# Patient Record
Sex: Male | Born: 1945
Health system: Southern US, Community
[De-identification: ages and names within clinical notes are randomized; demographics above are authoritative.]

## PROBLEM LIST (undated history)

## (undated) DIAGNOSIS — I2510 Atherosclerotic heart disease of native coronary artery without angina pectoris: Secondary | ICD-10-CM

## (undated) DIAGNOSIS — I739 Peripheral vascular disease, unspecified: Secondary | ICD-10-CM

## (undated) DIAGNOSIS — E119 Type 2 diabetes mellitus without complications: Secondary | ICD-10-CM

## (undated) DIAGNOSIS — I779 Disorder of arteries and arterioles, unspecified: Secondary | ICD-10-CM

## (undated) DIAGNOSIS — E785 Hyperlipidemia, unspecified: Secondary | ICD-10-CM

## (undated) DIAGNOSIS — I119 Hypertensive heart disease without heart failure: Secondary | ICD-10-CM

## (undated) DIAGNOSIS — Z8719 Personal history of other diseases of the digestive system: Secondary | ICD-10-CM

## (undated) DIAGNOSIS — I219 Acute myocardial infarction, unspecified: Secondary | ICD-10-CM

## (undated) HISTORY — DX: Morbid (severe) obesity due to excess calories: E66.01

## (undated) HISTORY — DX: Disorder of arteries and arterioles, unspecified: I77.9

## (undated) HISTORY — PX: UMBILICAL HERNIA REPAIR: SHX196

## (undated) HISTORY — DX: Type 2 diabetes mellitus without complications: E11.9

## (undated) HISTORY — DX: Personal history of other diseases of the digestive system: Z87.19

## (undated) HISTORY — DX: Peripheral vascular disease, unspecified: I73.9

## (undated) HISTORY — DX: Hypertensive heart disease without heart failure: I11.9

## (undated) HISTORY — DX: Atherosclerotic heart disease of native coronary artery without angina pectoris: I25.10

## (undated) HISTORY — PX: CATARACT EXTRACTION: SUR2

## (undated) HISTORY — DX: Hyperlipidemia, unspecified: E78.5

---

## 1999-07-17 ENCOUNTER — Emergency Department (HOSPITAL_COMMUNITY): Admission: EM | Admit: 1999-07-17 | Discharge: 1999-07-17 | Payer: Self-pay | Admitting: Emergency Medicine

## 2000-04-05 ENCOUNTER — Encounter (INDEPENDENT_AMBULATORY_CARE_PROVIDER_SITE_OTHER): Payer: Self-pay

## 2000-04-05 ENCOUNTER — Ambulatory Visit (HOSPITAL_COMMUNITY): Admission: RE | Admit: 2000-04-05 | Discharge: 2000-04-05 | Payer: Self-pay | Admitting: General Surgery

## 2000-04-05 ENCOUNTER — Encounter: Payer: Self-pay | Admitting: General Surgery

## 2002-02-22 ENCOUNTER — Encounter: Payer: Self-pay | Admitting: Vascular Surgery

## 2002-02-24 ENCOUNTER — Inpatient Hospital Stay (HOSPITAL_COMMUNITY): Admission: RE | Admit: 2002-02-24 | Discharge: 2002-02-25 | Payer: Self-pay | Admitting: Vascular Surgery

## 2002-02-24 ENCOUNTER — Encounter (INDEPENDENT_AMBULATORY_CARE_PROVIDER_SITE_OTHER): Payer: Self-pay | Admitting: Specialist

## 2002-02-24 HISTORY — PX: CAROTID ENDARTERECTOMY: SUR193

## 2005-08-14 ENCOUNTER — Ambulatory Visit (HOSPITAL_COMMUNITY): Admission: RE | Admit: 2005-08-14 | Discharge: 2005-08-14 | Payer: Self-pay | Admitting: Internal Medicine

## 2005-08-24 ENCOUNTER — Ambulatory Visit (HOSPITAL_COMMUNITY): Admission: RE | Admit: 2005-08-24 | Discharge: 2005-08-24 | Payer: Self-pay | Admitting: Cardiology

## 2005-09-11 ENCOUNTER — Inpatient Hospital Stay (HOSPITAL_COMMUNITY): Admission: RE | Admit: 2005-09-11 | Discharge: 2005-09-19 | Payer: Self-pay | Admitting: Surgery

## 2005-09-11 HISTORY — PX: CORONARY ARTERY BYPASS GRAFT: SHX141

## 2005-10-13 ENCOUNTER — Encounter: Admission: RE | Admit: 2005-10-13 | Discharge: 2005-10-13 | Payer: Self-pay | Admitting: Surgery

## 2007-03-22 IMAGING — CR DG CHEST 2V
3 series · 3 of 3 positions shown · non-contrast
Comparison: 08/24/05.

CLINICAL DATA: Pre-op respiratory exam for heart surgery.  Ex-smoker.  
 PA AND LATERAL CHEST:

[view not recorded (1 of 3)]
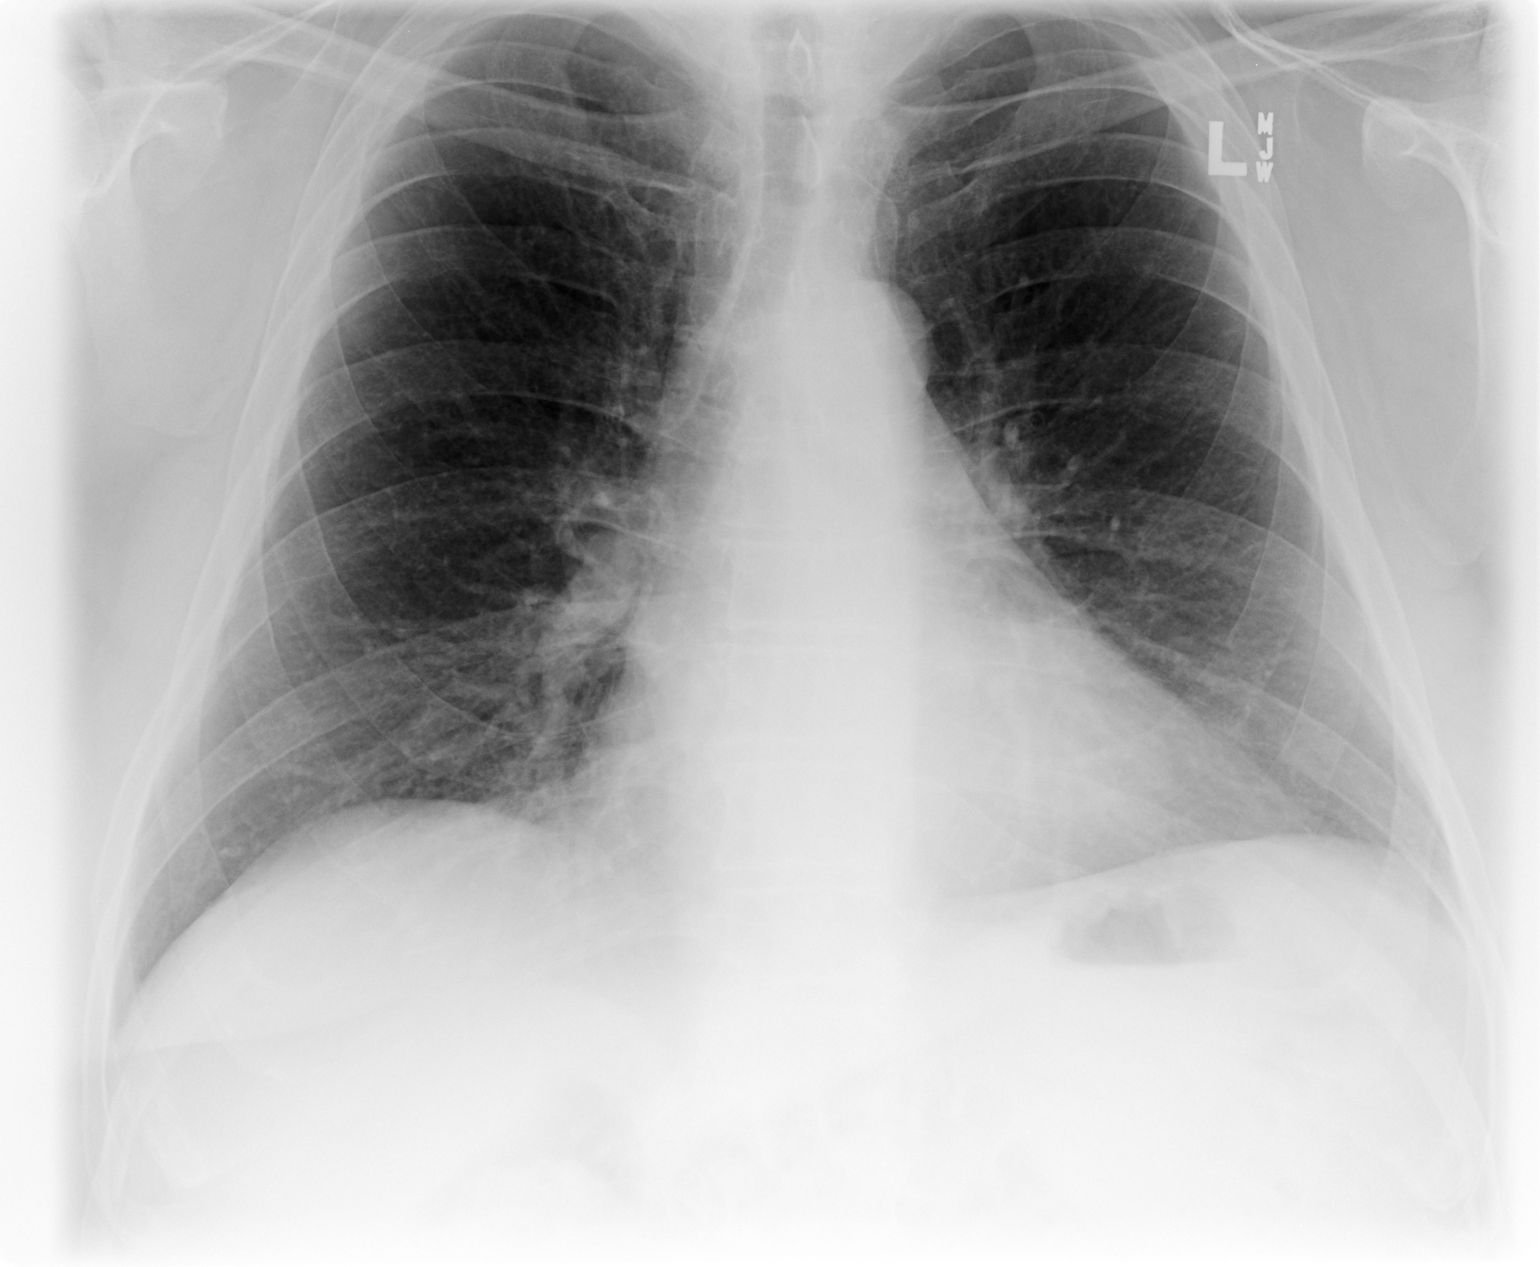

[view not recorded (2 of 3)]
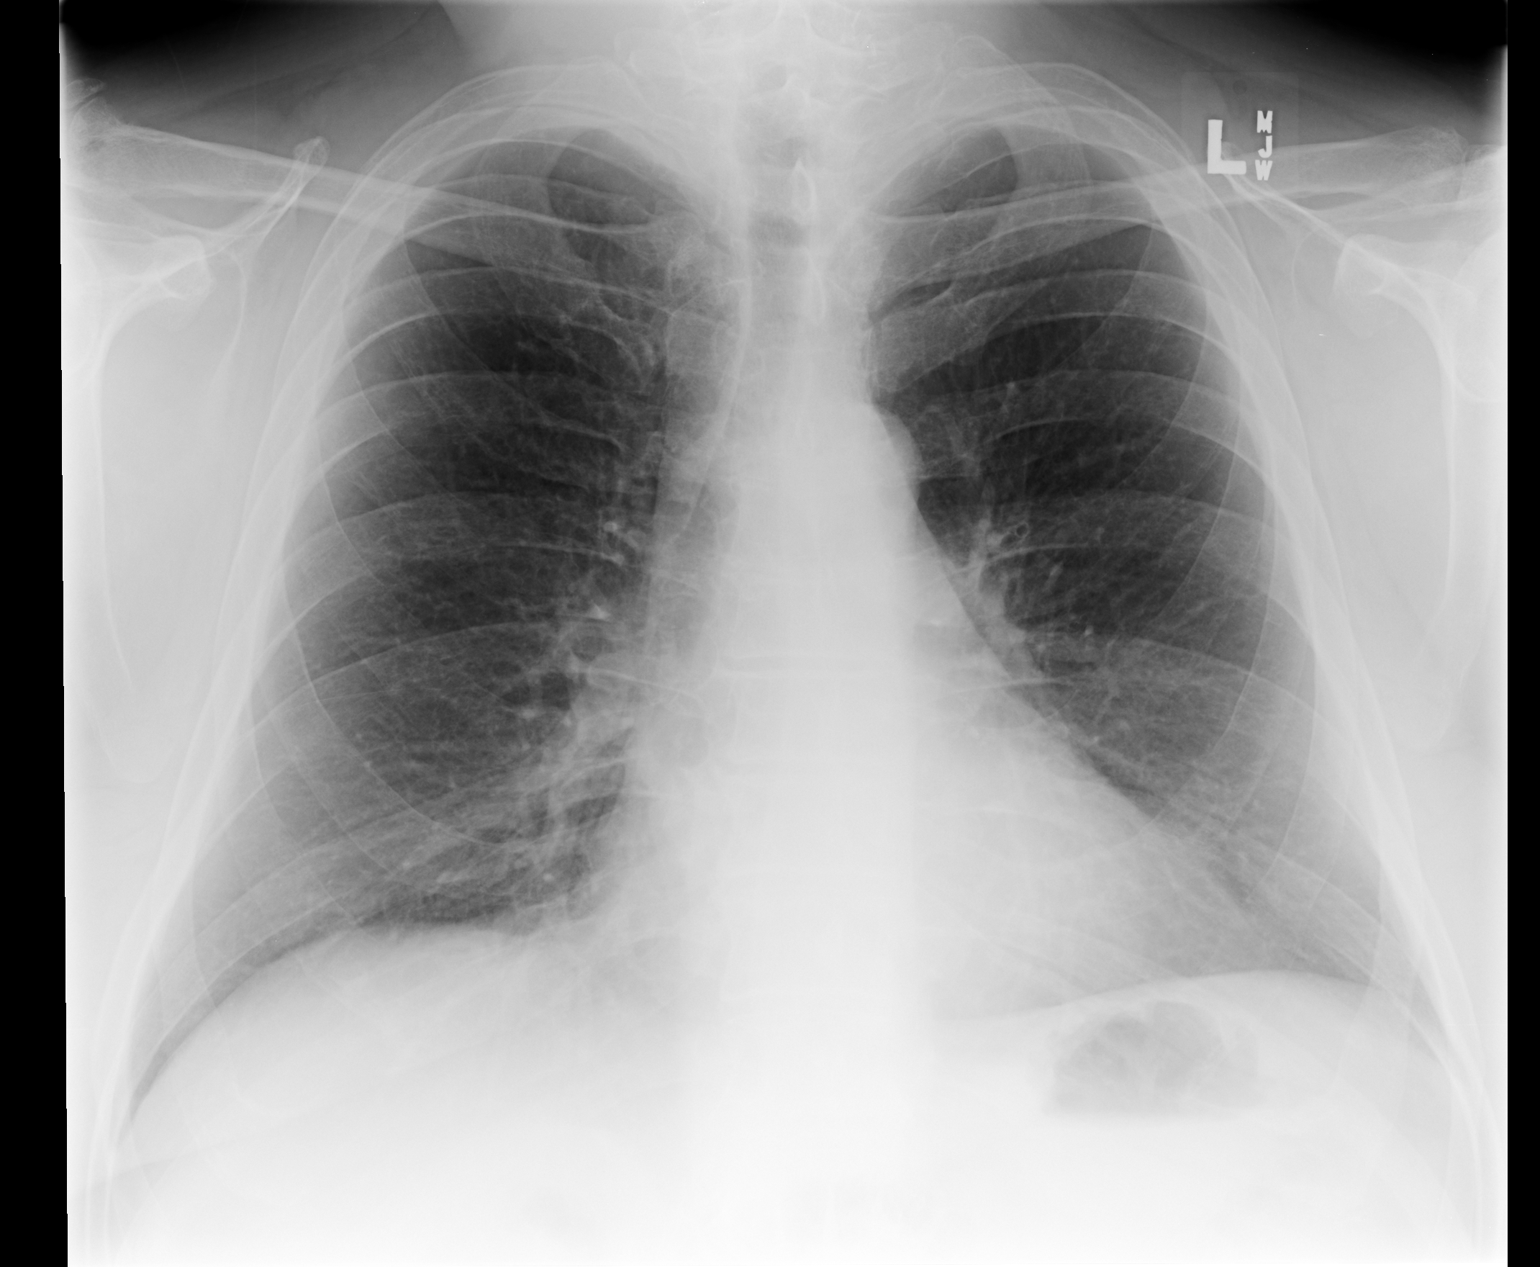

[view not recorded (3 of 3)]
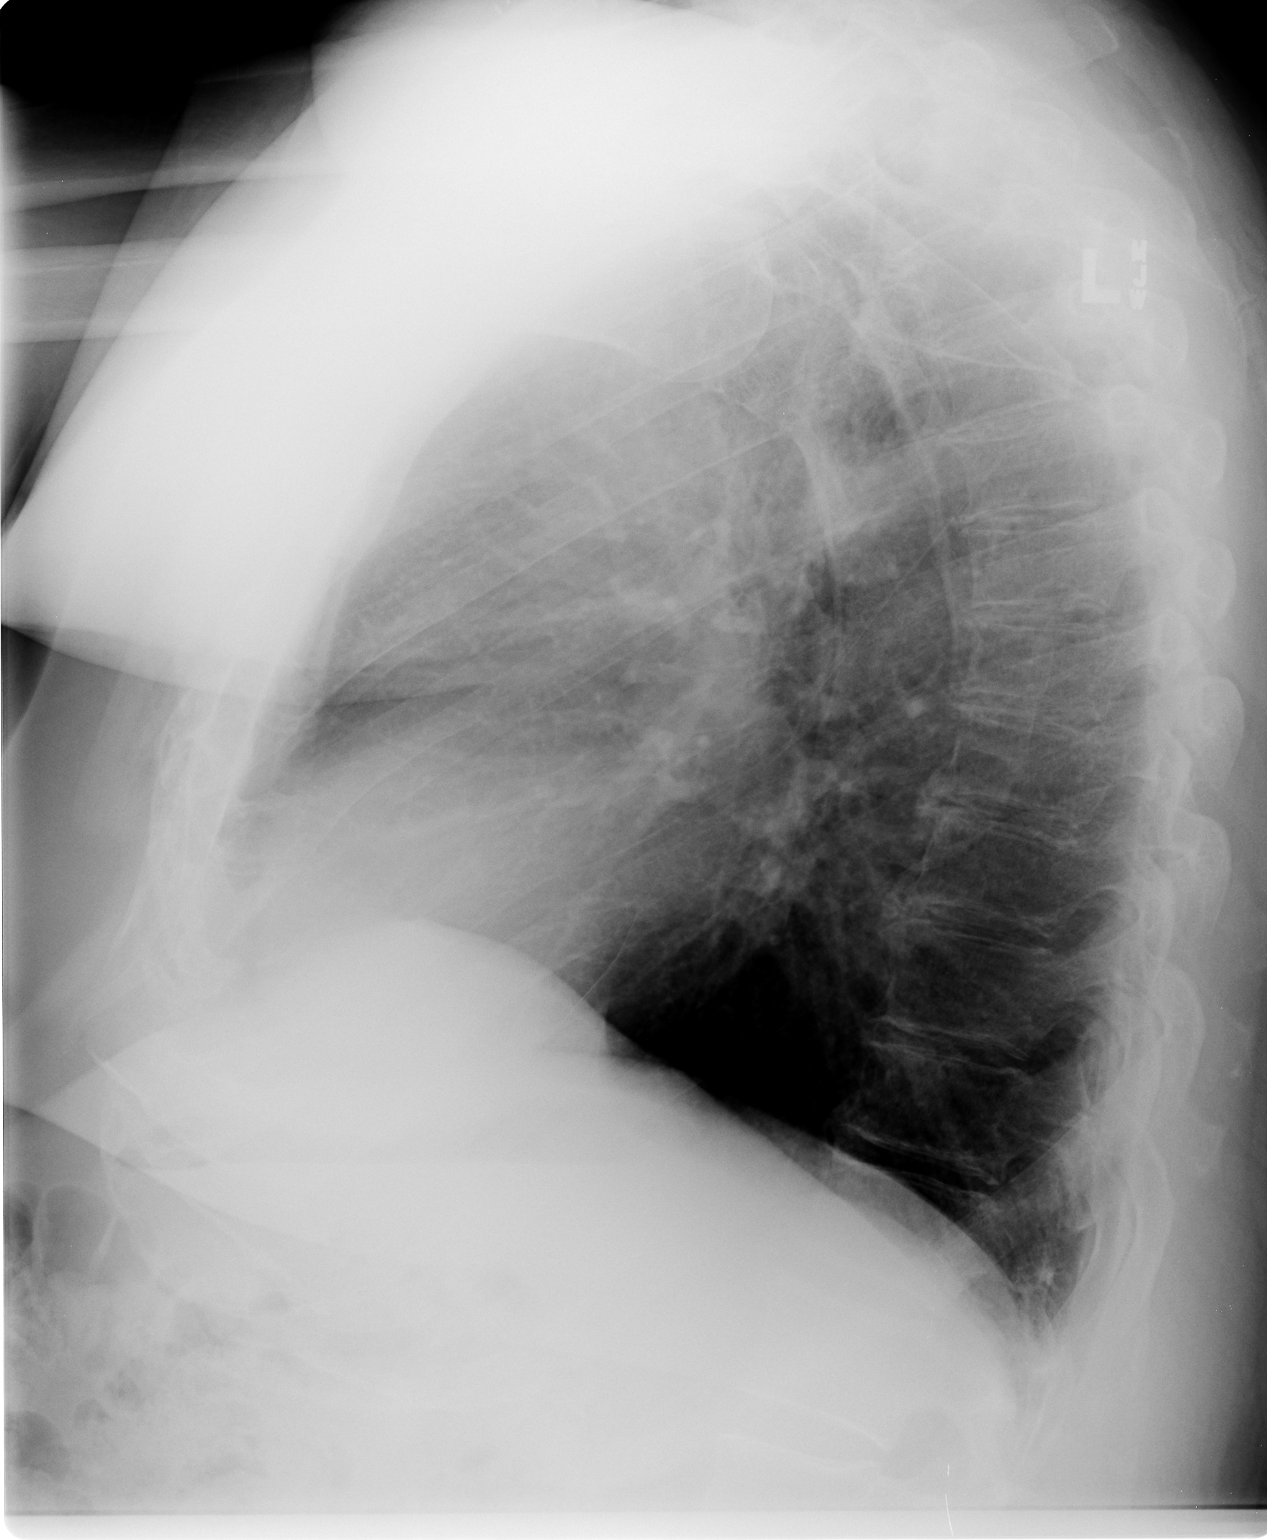

[3 of 3 positions shown; findings below may reference images not displayed]

FINDINGS: Heart size is stable at the upper limits of normal.  The lungs are clear.  There is no pleural effusion or pneumothorax.
IMPRESSION: Stable exam with borderline heart size.  No acute chest finding.

## 2007-08-19 ENCOUNTER — Ambulatory Visit: Payer: Self-pay | Admitting: Vascular Surgery

## 2008-03-23 ENCOUNTER — Ambulatory Visit: Payer: Self-pay | Admitting: Vascular Surgery

## 2010-08-10 ENCOUNTER — Encounter: Payer: Self-pay | Admitting: Surgery

## 2010-12-02 NOTE — Procedures (Signed)
CAROTID DUPLEX EXAM   INDICATION:  Followup evaluation of known carotid artery disease.   HISTORY:  Diabetes:  No.  Cardiac:  Coronary artery bypass graft in 2007.  Hypertension:  Yes.  Smoking:  Quit 1996.  Previous Surgery:  Left carotid endarterectomy 02/24/2002 by Dr. Arbie Cookey.  CV History:  The patient reports no cerebrovascular symptoms at this  time.  Previous duplex performed 08/19/2007 revealed a 60-79% right ICA  stenosis and no ICA stenosis status post left carotid endarterectomy.  Amaurosis Fugax, Paresthesias, Hemiparesis No                                       RIGHT             LEFT  Brachial systolic pressure:         118               116  Brachial Doppler waveforms:         Triphasic         Triphasic  Vertebral direction of flow:        Antegrade         Antegrade  DUPLEX VELOCITIES (cm/sec)  CCA peak systolic                   81                108  ECA peak systolic                   137               94  ICA peak systolic                   163               67  ICA end diastolic                   65                35  PLAQUE MORPHOLOGY:                  None              Soft  PLAQUE AMOUNT:                      Moderate          Mild  PLAQUE LOCATION:                    Proximal ICA, ECA Proximal ICA, CCA   IMPRESSION:  1. 60-79% right ICA stenosis.  2. 20-39% left ICA stenosis status post endarterectomy.  3. No significant change from previous study performed 08/19/2007.   ___________________________________________  Larina Earthly, M.D.   MC/MEDQ  D:  03/23/2008  T:  03/24/2008  Job:  [id number]

## 2010-12-02 NOTE — Procedures (Signed)
CAROTID DUPLEX EXAM   INDICATION:  Follow up known carotid artery disease.   HISTORY:  Diabetes:  No.  Cardiac:  Yes, CABG in 2007  Hypertension:  Yes.  Smoking:  Quit in 1996.  Previous Surgery:  Left carotid endarterectomy in 2003.  CV History:  No.  Amaurosis Fugax:  No, Paresthesias:  No, Hemiparesis:  No                                       RIGHT             LEFT  Brachial systolic pressure:         150               130  Brachial Doppler waveforms:         Biphasic          Biphasic  Vertebral direction of flow:        Antegrade         Antegrade  DUPLEX VELOCITIES (cm/sec)  CCA peak systolic                   80                93  ECA peak systolic                   141               99  ICA peak systolic                   221               59  ICA end diastolic                   73                20  PLAQUE MORPHOLOGY:                  Heterogeneous     None  PLAQUE AMOUNT:                      Moderate          None  PLAQUE LOCATION:                    ICA and ECA       None   IMPRESSION:  60% to 79% stenosis noted in the right internal carotid  artery.  Normal carotid duplex in left internal carotid artery.  Status  post left carotid endarterectomy.  Antegrade bilateral vertebral  arteries.   ___________________________________________  Larina Earthly, M.D.   MG/MEDQ  D:  08/19/2007  T:  08/20/2007  Job:  [id number]

## 2010-12-05 NOTE — Op Note (Signed)
NAME:  Ronnie Myers, Ronnie Myers NO.:  [id number]   MEDICAL RECORD NO.:  [id number]          PATIENT TYPE:  INP   LOCATION:  2304                         FACILITY:  [facility]   PHYSICIAN:  Evelene Croon, M.D.     DATE OF BIRTH:  [birth year]   DATE OF PROCEDURE:  09/11/2005  DATE OF DISCHARGE:                                 OPERATIVE REPORT   PREOPERATIVE DIAGNOSIS:  Severe three-vessel coronary disease.   POSTOPERATIVE DIAGNOSIS:  Severe three-vessel coronary disease.   OPERATIVE PROCEDURE:  Median sternotomy, extracorporeal circulation,  coronary bypass graft surgery x 4 using a left internal mammary artery graft  to the left anterior descending coronary, with a sequential saphenous vein  graft to the diagonal branch of the left anterior descending and the obtuse  marginal branch of the left circumflex coronary, and a saphenous vein graft  to the posterior descending branch of the right coronary.  Endoscopic vein  harvest from the right leg.   SURGEON:  Evelene Croon, M.D.   ASSISTANT:  Pecola Leisure, P.A.-C.  Sharla Kidney, P.A.-C.   ANESTHESIA:  General endotracheal.   CLINICAL HISTORY:  This patient is a 65 year old gentleman with history of  hypertension, hyperlipidemia, newly diagnosed diabetes, and obesity, who  presents with a 6-8 month history of chest discomfort typically occurring  with exertion.  He has had some episodes awaken him from sleep.  He  underwent coronary angiography that showed total occlusion of the proximal  right coronary artery with faint filling of the distal vessel by  collaterals.  The proximal LAD was occluded with faint collateral filling of  the distal vessel.  The left circumflex had a 90% stenosis and a large  marginal branch.  The right coronary gave off a moderate sized acute  marginal branch that had about 70% midvessel stenosis.  Left ventricular  ejection fraction about 55% with hypokinesis of the anterior wall.  There is  no  gradient across the aortic valve and no mitral regurgitation.  After  review of the angiogram and examination the patient, it was felt that  coronary bypass graft surgery is the best treatment.  I discussed operative  procedure with the patient and his wife including alternatives, benefits,  and risks including bleeding, blood transfusion, infection, stroke,  myocardial infarction, graft failure, and death.  We also discussed the  importance of maximum cardiac satisfactory reduction.  He understood and  agreed to proceed.   OPERATIVE PROCEDURE:  The patient was taken to the operating room and placed  on the table supine position.  After induction of general endotracheal  anesthesia, a Foley catheter was placed in bladder using sterile technique.  Then, the chest, abdomen, and both lower extremities were prepped and draped  in usual sterile manner.  The chest was entered through the hysterotomy  incision and the pericardium opened in the midline.  Examination heart  showed good ventricular contractility.  The ascending aorta had no palpable  plaques in it.   Then, the left internal mammary artery was harvested from the chest was a  pedicle graft.  This is  a medium caliber vessel with excellent blood flow  through it.  At the same time, a segment of greater saphenous vein was  harvested from the right leg using endoscopic vein harvest technique.  This  vein was of large caliber and good quality.  Below the knee, this vein did  not follow the course of a typical saphenous vein and began to deviate  towards the lateral aspect the leg.  It also became small and we did not  follow it any further.  We did examine the saphenous vein adjacent to the  left knee, but it was a very large vein and I did not feel it would be ideal  for sewing to this patient's relatively small coronary arteries. Therefore,  I decided not harvest any further vein and to use the two lengths that I had  from the right  thigh.   Then, the patient was heparinized and when an adequate activated clotting  time was achieved, the distal ascending aorta was cannulated using a 24  French aortic cannula for arterial in flow.  Venous outflow was achieved  using a two-stage venous cannula for the right atrial appendage.  An  antegrade cardioplegia and vent cannula was inserted in the aortic root.  Retrograde cardioplegic cannula was inserted through a pursestring suture in  the right atrium.   Then the patient placed on cardiopulmonary bypass and distal coronaries  identified.  The LAD was a relatively small, short artery.  It was suitable  for grafting.  The diagonal branch is small but graftable.  The obtuse  marginal was a large vessel that was very heavily diseased and this disease  continued up to the trifurcation into three sub-branches.  The right  coronary was diffusely diseased.  There was a moderate size posterior  descending branch that was heavily diseased proximally.  There was a small  posterolateral branch which was not large to graft itself.  The acute  marginal branch was lying deep beneath the thick layer of epicardial fat and  was also diffusely diseased and I did not feel this was graftable.   Then, the aorta was crossclamped and 500 mL of cold blood antegrade  cardioplegia was administered into the aortic root with quick arrest the  heart.  This was followed by 500 mL of cold blood retrograde cardioplegia.  Systemic hypothermia to 20 degrees centigrade and topical hypothermic with  iced saline was used.   Then, the first distal anastomosis was performed to the diagonal branch.  The internal diameter was about 1.5 mm.  The conduit used was a segment of  greater saphenous vein.  The anastomosis was performed in a sequential side-  to-side manner using continuous 7-0 Prolene suture.  Flow was measured  through the graft and was excellent.  The second distal anastomosis was performed to the  obtuse marginal branch.  The internal diameter of this vessel was about 1.75 mm.  The conduit used  was the same segment of greater saphenous vein.  The anastomosis was  performed in a sequential end-to-side manner using continuous 7-0 Prolene  suture.  Flow was measured through the graft and was excellent.  Another  dose of cardioplegia was given down vein graft in a retrograde manner.   A third distal anastomosis was performed to the posterior descending  coronary.  The internal diameter of this lesion was about 1.6 mm.  The  conduit used was a second segment of greater saphenous vein.  The  anastomosis was  performed in an end-to-side manner using continuous 7-0  Prolene suture.  Flow was measured through the graft and was excellent.   The fourth distal anastomosis was formed to the midportion of the left  anterior descending coronary.  The internal diameter was about 1.6 mm.  Conduit used was the left internal mammary artery graft and this was brought  through an opening in the left pericardium anterior to the phrenic nerve.  It was anastomosed the LAD in an end-to-side manner using a continuous 8-0  Prolene suture.  The pedicle was sutured to the epicardium using 6-0 Prolene  sutures.   Then, the patient was rewarmed to 37 degrees centigrade.  The two proximal  vein graft anastomoses were performed to the aortic root in an end-to-side  manner using continuous 6-0 Prolene suture.  Then, the clamp was removed  from the mammary pedicle.  There was rapid warming of the ventricular septum  and return of spontaneous ventricular fibrillation.  The crossclamp was  removed with a time of 87 minutes.  There is spontaneous return of sinus  rhythm.   The proximal and distal anastomoses appeared hemostatic and the lie of the  grafts satisfactory.  Graft markers were placed on the proximal anastomosis.  Two temporary right ventricular and right atrial pacing wires were placed  and brought  through the skin.   With the patient rewarmed 37 degrees centigrade, he was weaned from  cardiopulmonary bypass on no inotropic agents.  Total bypass time was 112  minutes.  Cardiac function appeared excellent with a cardiac output of 8  liters minute.  Protamine was given.  The venous and aortic cannulae were  removed without difficulty.  Hemostasis was achieved.  Three chest tubes  were placed, with two in the post pericardium, one in the left pleural  space, and one in the anterior mediastinum.  The pericardium was loosely  reapproximated over the heart.  The sternum was closed with number 6  stainless steel wires.  The fascia was closed with continuous #1 Vicryl  suture.  The subcutaneous tissue was closed with continuous 2-0 Vicryl and  the skin with 3-0 Vicryl subcuticular closure. The lower extremity vein  harvest site was closed in layers in a similar manner.  The sponge, needle, and counts were correct according to the scrub nurse.  Dry sterile dressings  were applied over the incisions and around the chest tubes which were hooked  to Pleur-Evac suction.  The patient remained hemodynamically stable and was  transferred to the SICU in guarded but stable condition.      Evelene Croon, M.D.  Electronically Signed     BB/MEDQ  D:  09/11/2005  T:  09/11/2005  Job:  [id number]   cc:   Georga Hacking, M.D.  Fax: [phone]  Email: [email or web address]

## 2010-12-05 NOTE — Discharge Summary (Signed)
NAME:  Ronnie Myers, Ronnie Myers                       ACCOUNT NO.:  [id number]   MEDICAL RECORD NO.:  [id number]                   PATIENT TYPE:  INP   LOCATION:  3307                                 FACILITY:  [facility]   PHYSICIAN:  Larina Earthly, M.D.                 DATE OF BIRTH:  1945/09/26   DATE OF ADMISSION:  02/24/2002  DATE OF DISCHARGE:                                 DISCHARGE SUMMARY   PRIMARY ADMITTING DIAGNOSIS:  Asymptomatic severe left internal carotid  artery stenosis.   ADDITIONAL DIAGNOSES:  1. Moderate right carotid stenosis.  2. Hyperlipidemia.  3. Hypertension.   PROCEDURE PERFORMED:  Left carotid endarterectomy with Dacron patch  angioplasty.   HISTORY OF PRESENT ILLNESS:  The patient is a 65 year old white male, a  patient of Dr. Jarome Matin who was found on recent physical examination  to have a carotid bruit.  He was referred to the cardiovascular and thoracic  surgery office and underwent a duplex scan which showed severe stenosis of  the left internal carotid artery with moderate stenosis on the right.  He  was seen and his tests were reviewed by Dr. Arbie Cookey, and it was felt that, in  order to reduce his risk of stroke, he should proceed with surgical  intervention at this time.   HOSPITAL COURSE:  The patient was taken to the operating room on August 8  and underwent a left carotid endarterectomy with Dacron patch angioplasty.  He tolerated the procedure well and was transferred to the floor in stable  condition.  Postoperatively, he has done well.  He has remained afebrile and  vital signs have been stable.  He has been weaned off supplemental oxygen  and his oxygen saturations have been greater than 90% on room air.  He has  been tolerating a regular diet and has normal bowel and bladder function.  His incision is healing well.  He has remained neurologically intact  throughout the intact.  It is felt at this time he may be discharged home.   DISCHARGE MEDICATIONS:  1. Tylox 1-2 p.o. q.4h. p.r.n. for pain.  2. Enteric-coated aspirin 325 mg p.o. q.d.  3. Pravachol 40 mg p.o. q.h.s.  4. Toprol XL 25 mg p.o. q.d.   DISCHARGE INSTRUCTIONS:  He is to refrain from driving, heavy lifting, or  strenuous activity.  He may continue daily walking and the use of the  incentive spirometer.  He may shower daily and clean his incisions with soap  and water.    FOLLOWUP APPOINTMENTS:  He will be seen in the cardiovascular and thoracic  surgery office by Dr. Arbie Cookey in three weeks.  He is asked to call if he has  any problems in the interim.     Gina L. Thomasena Edis, P.A.                     Larina Earthly,  M.D.    GLC/MEDQ  D:  02/25/2002  T:  03/01/2002  Job:  [id number]   cc:   Barry Dienes. Eloise Harman, M.D.

## 2010-12-05 NOTE — Cardiovascular Report (Signed)
NAME:  Ronnie Myers, Ronnie Myers NO.:  [id number]   MEDICAL RECORD NO.:  [id number]          PATIENT TYPE:  OIB   LOCATION:  2899                         FACILITY:  [facility]   PHYSICIAN:  Georga Hacking, M.D.DATE OF BIRTH:  1946-02-28   DATE OF PROCEDURE:  08/24/2005  DATE OF DISCHARGE:                              CARDIAC CATHETERIZATION   HISTORY:  The patient is a 65 year old male with hypertension, glucose  intolerance, hyperlipidemia and obesity who presented with chest discomfort  worse after meals but also with exertion.  He had an abnormal EKG with  previous anterior infarction on it.  Because of the progressive natures of  symptoms, he was taken directly to catheterization.   PROCEDURE:  Left heart catheterization with coronary angiograms and left  ventriculogram, Angio-Seal closure of right femoral artery,.   COMMENTS ABOUT PROCEDURE:  The patient tolerated the procedure well without  complications. The right femoral artery was entered using a single anterior  needle wall stick. He tolerated suture well without complications. Following  the procedure, he had a right femoral angiogram done and then Angio-Seal  closure of the right femoral artery was done without complications with good  hemostasis.   HEMODYNAMIC DATA:  Aorta post contrast 152/81, LV post contrast 152/10-29.   ANGIOGRAPHIC DATA:  Left ventriculogram:  Performed in the 30 degree RAO  projection. The aortic valve is normal. The mitral valve is normal. The left  ventricle appears normal in size. There is hypokinesis of the anterior wall  with an estimated ejection fraction of 55%, remaining ventricular segments  contracted normally. Coronary arteries arise and distribute normally,  significant calcifications noted involving the proximal left main, LAD and  proximal and mid right coronary artery.  The left main coronary artery  appears normal with mild calcification.   Left anterior descending:   100% occluded proximally after the septal  perforator, faint system collaterals is seen filling the LAD through the  left system. A diagonal branch is also seen.   Circumflex coronary artery:  Severe 90% stenosis prior to a large marginal  branch. The continuation branch of the circumflex supplies a second and  third marginal artery that contained mild irregularity.  A large atrial  collateral is seen supplying the distal right coronary artery which is  occluded. The right coronary is calcified in its proximal and mid portion.  It gives off a large acute marginal branch with an 80% stenosis prior to the  origin of this.  There is a 70% mid vessel stenosis in this acute marginal  branch which is a large vessel and appears to supply collaterals to either  the distal right coronary artery or the distal LAD.  The continuation of the  right artery is occluded and collateral filling is seen from the left  coronary system.   IMPRESSION:  1.  Severe three-vessel coronary disease with total occlusion of right      coronary artery, left anterior descending and severe stenosis involving      the first marginal branch of the obtuse marginal artery and severe  disease involving      the acute marginal artery which is large of the right coronary artery.  2.  Successful Angio-Seal of right femoral artery.   RECOMMENDATIONS:  The patient will be considered for coronary bypass  grafting.      Georga Hacking, M.D.  Electronically Signed     WST/MEDQ  D:  08/24/2005  T:  08/24/2005  Job:  [id number]   cc:   Barry Dienes. Eloise Harman, M.D.  Fax: [phone]

## 2010-12-05 NOTE — H&P (Signed)
NAME:  Ronnie Myers, Ronnie Myers                       ACCOUNT NO.:  [id number]   MEDICAL RECORD NO.:  [id number]                   PATIENT TYPE:  INP   LOCATION:  3307                                 FACILITY:  [facility]   PHYSICIAN:  Maple Mirza, PA                DATE OF BIRTH:  08-Jun-1946   DATE OF ADMISSION:  DATE OF DISCHARGE:  02/25/2002                                HISTORY & PHYSICAL   PRESENTING PROBLEM:  I am here for a physical examination, getting ready  for surgery.   HISTORY OF PRESENT ILLNESS:  The patient is a 65 year old male referred by  Dr. Eloise Harman for evaluation of carotid artery disease.  A carotid bruit was  detected in physical examination and a follow up carotid duplex which was  done on 02/08/02, demonstrated severe left internal carotid artery stenosis  and moderate right internal carotid artery stenosis.  He has been having  symptoms occasionally of tingling in the right hand when driving.  He  presents for a left carotid endarterectomy on 02/24/02.  He has been seen by  Dr. Tawanna Cooler Early who described the risks and benefits of the surgery with  primary effect to prevent stroke.  The patient denies any prior history of  syncope, seizure, unilateral weakness, or numbness in the upper or lower  extremities.  No history of amaurosis fugax, no diplopia, no memory loss or  confusion.  The patient has no history of diabetes mellitus, coronary artery  disease, or peptic ulcer disease.   PAST MEDICAL HISTORY:  1. Extracranial cerebrovascular occlusive disease.  2. Hypertension.  3. Hypercholesterolemia.   MEDICATIONS:  1. Enteric coated aspirin 325 mg q.d.  2. Pravachol 40 mg q.d. at bedtime.  3. Toprol XL 25 mg q.d.   PAST SURGICAL HISTORY:  Status post umbilical herniorrhaphy in 9/01.   ALLERGIES:  No known drug allergies.   FAMILY HISTORY:  Significant for his father dying at age 55 of a myocardial  infarction.   SOCIAL HISTORY:  The patient is married.  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  He does not partake of  alcoholic beverages.  He did have a history of tobacco habituation with a 1-  1/2 pack per day smoking history spanning 35+ years, he quit in 1996.   PHYSICAL EXAMINATION:  GENERAL:  This is an alert and oriented gentleman in  no acute distress.  He is moderately obese.  VITAL SIGNS:  Blood pressure 134/82, left upper extremity, pulse 68 and  regular.  HEENT:  Normocephalic, atraumatic.  Pupils equal, round, reactive to light.  Extraocular movements were intact.  NECK:  Supple, no jugular venous distention.  A soft left carotid bruit is  auscultated.  Right carotid bruit is not present.  LUNGS:  Clear to auscultation and percussion bilaterally,  HEART:  Regular rate and rhythm, without murmurs, rubs, or gallops.  ABDOMEN:  Soft, nontender, nondistended, bowel sounds are present.  There is  a well healed cicatrix just above the umbilicus at midline it is transverse.  GENITOURINARY:  Deferred.  EXTREMITIES:  No evidence of cyanosis, clubbing, or edema.  He had palpable  pedal pulses bilaterally.  There are no ischemic ulcerations to the lower  extremities.  NEUROLOGIC:  Nonfocal.  The gait is steady.  Grip strength is 5/5  bilaterally.   IMPRESSION:  1. Extracranial cerebrovascular occlusive disease with left internal carotid     artery stenosis which is severe, and moderate right internal carotid     artery stenosis.  2. Hypertension.  3. Hypercholesterolemia.  4. Strong family history of myocardial infarction in his father.   PLAN:  Left carotid endarterectomy on 02/24/02, Dr. Tawanna Cooler Early, surgeon.                                                 Maple Mirza, PA    GM/MEDQ  D:  02/22/2002  T:  02/26/2002  Job:  [id number]

## 2010-12-05 NOTE — Op Note (Signed)
NAME:  Ronnie Myers, Ronnie Myers NO.:  [id number]   MEDICAL RECORD NO.:  [id number]            PATIENT TYPE:   LOCATION:                                 FACILITY:   PHYSICIAN:  James L. Malon Kindle., M.D.DATE OF BIRTH:  11/12/1945   DATE OF PROCEDURE:  09/17/2005  DATE OF DISCHARGE:                                 OPERATIVE REPORT   PROCEDURE:  Esophagoscopy, duodenoscopy, and biopsy.   ENDOSCOPIST:  Llana Aliment. Randa Evens, M.D.   MEDICATIONS:  Cetacaine spray, Fentanyl 50 mcg, Versed 6 mg IV.   INDICATIONS:  The patient had a low-grade, upper GI bleed following bypass  surgery.  He does need to be on anticoagulation and has had a brief run of  atrial fibrillation.  The issue is whether to start Coumadin.  This is done  to look for a source of his bleeding.   DESCRIPTION OF PROCEDURE:  The procedure explained to the patient, consent  obtained, in the left lateral decubitus position the Olympus scope was  inserted in advanced.  The scope then passed.  The duodenum including the  bulb and second portion was entirely unremarkable.  The scope was withdrawn  back into the stomach; and on the posterior antrum in the immediate  prepyloric region there was a 1.5 cm deep, prepyloric ulcer that was not  actively bleeding.  There were no visible vessels.  Away from the ulcer, a  biopsy was taken for rapid urease test for Helicobacter.  No other  ulcerations or abnormalities were seen.  The scope was then withdrawn.  The  distal and proximal esophagus were endoscopically normal.  The patient  tolerated the procedure well and was maintained on low flow oxygen and pulse  oximeter throughout the procedure.   ASSESSMENT:  Gastric ulcer probably the source of upper GI bleed, stable at  present.   PLAN:  1.  Would recommend holding the Coumadin for 2-3 more days before starting      it; and continue PPI b.i.d. for a week then daily, should be adequate.  2.  He will need followup endoscopy in  6-8 weeks.           ______________________________  Llana Aliment Malon Kindle., M.D.     Waldron Session  D:  09/17/2005  T:  09/17/2005  Job:  [id number]   cc:   Fayrene Fearing L. Malon Kindle., M.D.  Fax: [phone]   Evelene Croon, M.D.  [address]  [place]  [place] [address]   W. Viann Fish, M.D.  Fax: [phone]  Email: [email or web address] .Remer Macho. Eloise Harman, M.D.  Fax: [phone]

## 2010-12-05 NOTE — Discharge Summary (Signed)
NAME:  Ronnie Myers, Ronnie Myers NO.:  [id number]   MEDICAL RECORD NO.:  [id number]          PATIENT TYPE:  INP   LOCATION:  2024                         FACILITY:  [facility]   PHYSICIAN:  Evelene Croon, M.D.     DATE OF BIRTH:  07-24-1945   DATE OF ADMISSION:  09/11/2005  DATE OF DISCHARGE:                                 DISCHARGE SUMMARY   PRIMARY DIAGNOSIS:  Severe three-vessel coronary artery disease.   IN-HOSPITAL DIAGNOSES:  1.  Newly diagnosed type 2 diabetes mellitus.  2.  Postoperative atrial fibrillation.  3.  Acute blood loss anemia postoperatively.  4.  Prepyloric gastric ulcer.  5.  Right forearm phlebitis.   SECONDARY DIAGNOSES:  1.  Hypertension.  2.  Hyperlipidemia.  3.  Obesity.  4.  Status post left carotid endarterectomy.  5.  Bilateral cataract extraction.  6.  Umbilical hernia.  7.  History of hiatal hernia.   ALLERGIES:  The patient is allergic to ACE INHIBITORS and LIPITOR.   IN-HOSPITAL OPERATION/PROCEDURE:  1.  Coronary artery bypass grafting times four using a left internal mammary      artery graft to the left anterior descending coronary, sequential      saphenous vein graft to the diagonal branch of the left anterior      descending and obtuse marginal branch of the left circumflex coronary,      saphenous vein graft to the posterior descending branch of the RCA.      Endoscopic vein harvesting from the right leg.  2.  Esophagoscopy, duodenoscopy, and hot biopsy.   PATIENT'S HISTORY AND PHYSICAL AND HOSPITAL COURSE:  The patient is a 65-  year-old gentleman with history of hypertension and hyperlipidemia, newly  diagnosed diabetes mellitus and obesity, who presents with a six to eight  month history of chest discomfort typically occurring with exertion. The  patient has had some episodes awaking him from sleep. He recently underwent  coronary angiography that showed total occlusion of the proximal RCA with  faint feeling of the distal  vessel by collaterals. The proximal LAD was  occluded with faint collateral filling in the distal vessel. The left  circumflex had a 90% stenosis in a large marginal branch. The RCA involves a  moderate size acute marginal branch that had about 70% mid vessel stenosis.  The left ventricular ejection fraction was about 55% hypokinesis of the  anterior wall. There was no gradient across the aortic valve and no mitral  regurgitation. Following catheterization Dr. Laneta Simmers was consulted. The  patient was seen and evaluated by Dr. Laneta Simmers. Dr. Laneta Simmers discussed with the  patient undergoing coronary artery bypass grafting. He discussed the risks  and benefits of this procedure. The patient acknowledged understanding and  agreed to proceed. Surgery was scheduled for September 11, 2005.   The patient was taken to the operating room on September 11, 2005, where he  underwent coronary artery bypass grafting times four using a left internal  mammary artery to the left anterior descending coronary, sequential  saphenous vein graft to the diagonal branch of the left anterior descending  and obtuse marginal branch to the left circumflex coronary, saphenous vein  graft to the posterior descending branch of the RCA. Endoscopic vein  harvesting from the right leg. The patient tolerated this procedure well and  was transferred up to the intensive care unit in stable condition.  Immediately following surgery the patient seemed to be hemodynamically  stable. The patient was extubated late evening and early morning following  surgery.  Following extubation the patient was seen to be alert and oriented  times three, and neurologically intact. During the patient's postoperative  course he did develop postoperative atrial fibrillation on postoperative day  #3.  The patient was started on IV amiodarone at that time. The patient was  able to convert back to normal sinus rhythm for a short period of time and  then was  back on atrial fibrillation. The patient was continued on  amiodarone and Lopressor. The patient received another bolus of IV  amiodarone and after this he remained in normal sinus rhythm during the  remainder of his hospital course. Will hold off on starting Coumadin. Also  postoperatively the patient developed acute blood loss anemia. His  hemoglobin and hematocrit dropped to 7.5 and 22.6. He did require several  units of packed red blood cells.  Unfortunately his hemoglobin continued to  drop. It was felt to consult GI at that time. The patient was evaluated by  Dr. Carman Ching. It was felt to take the patient down for a esophagoscopy  with  duodenoscopy and biopsy. This was done on September 17, 2005. During this  procedure Dr. Randa Evens noted a prepyloric gastric ulcer which was probably  the source of the GI bleed, but was stable at the time of evaluation. He  recommended at that time continuing the patient on a  proton pump inhibitor.  Will evaluate the patient's hemoglobin and hematocrit prior to discharge. He  will be discharged home on folic acid as well as iron pills p.o.   The patient was noted to be a newly diagnosed diabetic. Following surgery he  was seen to be hypoglycemic. He was started on sliding scale insulin. This  monitored and unfortunately his CBGs remained significantly high even on a  sliding scale. He was started on Glucophage at that time. CBGs were  continued to be monitored. They were stabilized prior to the patient's  discharge home. He will need to be discharged home on a low fat, low salt  diet as a carbohydrate modified medium calorie diet.   The patient developed a right forearm phlebitis following infusion of IV  amiodarone. Warm compresses were applied and the patient was started on  Keflex. This did start to improve prior to discharge home, but he will be  discharged home on an antibiotic for several days.  The patient's lines and chest tubes were  discontinued in the normal fashion.  He was out of bed, ambulating well postoperatively. The patient was  transferred up to 2000 on postoperative day six. During the patient's  hospital stay he was seen to be afebrile. He was able to be weaned off  oxygen saturating greater than 90% on room air. The patient's vital signs  were monitored during his hospital stay and his medications were adjusted  appropriately. The patient was seen to be normal sinus rhythm at the time of  discharge. His incisions are dry, intact, and healing well. The patient was  tolerating a regular diet well. No nausea or vomiting noted. Bowel movements  were within  normal limits. On physical exam, respiratory was clear to  auscultation bilaterally.   The patient is tentatively ready for discharge home on September 19, 2005, on  postoperative day 8. A follow-up appointment  has been arranged with Dr.  Laneta Simmers for October 06, 2005, at 1 p.m. The patient will need to obtain a PA  and lateral chest x-ray one hour prior to this appointment. The patient will  need to contact Dr. York Spaniel office to schedule a follow-up appointment with  him in two weeks. Mr. Mochizuki received instructions on diet, activity level,  and incisional care. He was told no driving until released to do so, and no  heavy lifting over 10 pounds. He is told he is allowed to shower, washing  his incisions using soap and water. He is to contact the office he develops  any drainage or openings from any of his incision sites. The patient is told  to ambulate three to four times a day as tolerated and to continue his  breathing exercises. He was educated on diet, activity, low fat, low salt as  well as carbohydrate modified medium calorie diet. Outpatient diabetes  education has been arranged.   DISCHARGE MEDICATIONS:  1.  Aspirin 81 mg daily.  2.  Ambien 10 mg q.h.s. p.r.n.  3.  Toprol XL 100 mg daily.  4.  Vytorin 10/40  mg daily.  5.  Protonix 40 mg b.i.d.  times seven days, then 40 mg daily.  6.  Amiodarone 400 mg b.i.d. times 14 days, then 200 mg b.i.d.  7.  Folic acid 1 mg daily.  8.  Metformin 1000 mg b.i.d.  9.  Keflex 500 mg t.i.d. times eight days.  10. Ferrous gluconate 325 mg b.i.d.  11. __________0.5 mg t.i.d. p.r.n.  12. Oxycodone 5 mg one to two tabs q.4-6h. p.r.n. pain.      Theda Belfast, [place]      Evelene Croon, M.D.  Electronically Signed    KMD/MEDQ  D:  09/18/2005  T:  09/18/2005  Job:  [id number]   cc:   Georga Hacking, M.D.  Fax: [phone]  Email: [email or web address]

## 2010-12-05 NOTE — Op Note (Signed)
NAME:  Ronnie Myers, Ronnie Myers                       ACCOUNT NO.:  [id number]   MEDICAL RECORD NO.:  [id number]                   PATIENT TYPE:  INP   LOCATION:  3307                                 FACILITY:  [facility]   PHYSICIAN:  Larina Earthly, M.D.                 DATE OF BIRTH:  20-Dec-1945   DATE OF PROCEDURE:  DATE OF DISCHARGE:  02/25/2002                                 OPERATIVE REPORT   PREOPERATIVE DIAGNOSIS:  Asymptomatic severe left internal carotid artery  stenosis.   POSTOPERATIVE DIAGNOSIS:  Asymptomatic severe left internal carotid artery  stenosis.   PROCEDURE:  Left carotid endarterectomy and Dacron patch angioplasty.   SURGEON:  Larina Earthly, M.D.   ASSISTANTS:  1. Caralee Ates, M.D.  2. Gina L. Collins, P.A.-C.   ANESTHESIA:  General endotracheal.   COMPLICATIONS:  None.   DISPOSITION:  To recovery room neurologically intact.   DESCRIPTION OF PROCEDURE:  The patient was taken to the operating room,  placed in the supine position where the area of the left neck was prepped  and draped in the usual sterile fashion.  Incision was made anterior to the  sternocleidomastoid and carried down through the platysma with  electrocautery.  Sternocleidomastoid was reflected posteriorly, and the  carotid sheath was opened.  The facial vein was ligated with 2-0 silk ties  and divided.  The common carotid artery was encircled with umbilical tape  and Rumel tourniquet.  The vagus and hypoglossal nerves were identified and  preserved.  Dissection was carried down to the bifurcation.  The superior  thyroid artery was encircled with a 2-0 silk Potts tie.  The external  carotid was encircled with a blue vessel loop, and the internal carotid was  encircled with a umbilical tape and Rumel tourniquet.  The patient was given  10,000 units of intravenous heparin.  After adequate circulation time, the  internal, external and common carotid arteries were occluded.  The common  carotid  artery was opened with an 11 blade and extended with Potts scissors  through the plaque on into the internal carotid artery.  There was severe  stenosis at the carotid bifurcation.  Plaque extended up onto the internal  carotid, and the endarterectomy was extended further proximal to the plaque.  The plaque was also extensive in the common carotid artery, and the  endarterectomy was extended down below this level as well.  The  endarterectomy was begun in the common carotid artery, and the plaque was  divided proximally with Potts scissors.  The endarterectomy was extended  onto the bifurcation.  The external carotid was endarterectomized with  eversion technique.  The internal carotids were endarterectomized in an open  fashion.  The remaining atheromatous debris was removed from the  endarterectomy plane.  A Finesse Hemashield Dacron patch was brought onto  the field and was sewn as a patch angioplasty with a running 6-0  Prolene  suture.  Prior to completion of the anastomosis, the shunt was removed, and  the usual flushing maneuvers were undertaken.  The anastomosis was then  completed, the external followed by the common and finally the internal  carotid artery.  The occlusion clamp was removed.  Excellent flow  characteristics were noted with hand-hold Doppler in the internal and  external carotid arteries.  The  patient was given 50 mg of Protamine to reverse the heparin.  The wounds  were irrigated with saline.  Hemostasis was obtained with electrocautery.  The wounds were closed with 3-0 Vicryl in the subcutaneous and subcuticular  tissue.  Benzoin and Steri-Strips were applied.                                               Larina Earthly, M.D.    TFE/MEDQ  D:  02/24/2002  T:  02/25/2002  Job:  [id number]

## 2010-12-05 NOTE — Op Note (Signed)
[facility]  Patient:    Ronnie Myers, Ronnie Myers                    MRN: [id number] Proc. Date: 04/05/00 Adm. Date:  [id number] Attending:  Carson Myrtle                           Operative Report  PREOPERATIVE DIAGNOSES:  Primary ventral hernia.  POSTOPERATIVE DIAGNOSES:  Primary ventral hernia.  PROCEDURE:  Repair with mesh.  SURGEON:  Kendrick Ranch, M.D.  ANESTHESIA:  General.  HISTORY OF PRESENT ILLNESS:  Mr. Orsak is an otherwise healthy Caucasian male. He is rather grossly overweight at 315 pounds and has developed a primary ventral hernia just above the umbilicus that seems to be clearly separated from the umbilicus. Because of its pain and increasing size, he would like to have it repaired and we have carefully discussed that.  The patient taken to the operating room, placed supine, general endotracheal anesthesia administered. The abdomen had been shaved. It was prepped and draped. The hernia was palpable approximately 3 cm above the umbilicus and slightly to the left. This area was infiltrated with 0.25% with epinephrine and a generous incision made, the subcutaneous tissue dissected. The hernia distinctly seen and dissected from the surrounding subcutaneous tissue. The defect itself was approximately 2.5 cm and contained preperitoneal fat although there was a hernia sac in the free peritoneal cavity. The area was dissected around and about this hernia and it clearly did not involve the umbilicus. The remainder of the fascia seemed intact. The fascia and peritoneum were closed in a single horizontal layer with #0 Prolene suture. The hernia sac and preperitoneal fat had been sharply dissected away with cautery and submitted for pathology. A piece of mesh was cut and fashioned to fit the entire area approximately 2 x 2 inches and this was sewn down with interrupted and running #0 PDS suture. This completed the repair. The subcu was  approximated and the skin was closed with Vicryl, Steri-Strips applied, counts correct. The patient tolerated the procedure well and was awakened and taken to the recovery room in good condition. DD:  04/05/00 TD:  04/06/00 Job: [id number]

## 2010-12-05 NOTE — Consult Note (Signed)
NAME:  Ronnie Myers, Ronnie Myers NO.:  [id number]   MEDICAL RECORD NO.:  [id number]          PATIENT TYPE:  INP   LOCATION:  2304                         FACILITY:  [facility]   PHYSICIAN:  Fayrene Fearing L. Malon Kindle., M.D.DATE OF BIRTH:  1946/06/26   DATE OF CONSULTATION:  09/16/2005  DATE OF DISCHARGE:                                   CONSULTATION   This nice 65 year old gentleman is referred for GI bleeding.  He has had a  history of intermittent heartburn but has not been on any kind of routine  therapy for that.  He had chest discomfort and was seen by Dr. Viann Fish and given some Nexium samples b.i.d., it was not clear that it really  helped, he never really go the Nexium filled and stopped taking it several  days prior to his admission here.  He has intermittently had tarry stools,  but that has not been that common.  Dr. Donnie Aho did a cardiac catheterization  and this showed severe triple vessel disease.  This resulted in his  admission to the hospital and he underwent CABG on September 11, 2005, and  other than a brief run of atrial fibrillation, he has done fairly well.  He  had a drop in hemoglobin from 14.8 with a normal MCV preop to 7.2.  He had  several dark, tarry stools.  These were sent to the lab and came back  positive for blood.  He has been transfused, his hemoglobin is now 8.8.  He  has not had any further dark stools to date.  He has been eating and is back  in sinus rhythm and is eating fairly well.  He is on Protonix 40 mg p.o.  b.i.d.  The patient had been on Lovenox subcu and he will be started on  Coumadin protocol, but this has been on hold since his bleeding.  It is also  notable that his BUN is slightly up at 34 with a creatinine of 0.9.  The  patient has no history of ulcer disease, only intermittently has had  heartburn.   CURRENT MEDICATIONS:  Dulcolax, Protonix, folic acid, Zocor, Amiodarone,  Lopressor, Glucophage, aspirin which is on hold,  Coumadin on hold, insulin,  Lantus 30 units q.h.s., Oxycodone, Phenergan, Ultram, and Tylenol.   ALLERGIES:  ACE INHIBITORS causing cough, LIPITOR causing myalgias.   PAST MEDICAL HISTORY:  Hyperlipidemia, obesity, essential hypertension, he  has had carotid stenosis requiring surgery, coronary artery disease with  recent CABG.  His other surgeries included carotid endarterectomy,  cataracts, umbilical hernia repair.   FAMILY HISTORY:  Father died of a heart attack at 62.  Mother is 86 and  living.  He has a sister who is healthy.  There is diabetes and heart  disease in other family members.   SOCIAL HISTORY:  He quit smoking in 1995, he is married, he does not drink,  he has never had liver disease.   REVIEW OF SYMPTOMS:  Remarkable for occasional heartburn, he takes  occasional antacids, he has not ever been on chronic H2 blockers or PPIs.  He has not had a problem with sleep apnea requiring CPAP.   PHYSICAL EXAMINATION:  GENERAL:  The patient is an obese white male in no acute distress, he is  alert and oriented.  HEENT:  Eyes:  Sclerae nonicteric.  Throat normal.  LUNGS:  Clear.  HEART:  Regular rate and rhythm without murmurs or gallops.  ABDOMEN:  Examined in the sitting position, is markedly obese and nontender.   ASSESSMENT:  Subacute GI bleed - this could be from a relatively  insignificant source since he was on anticoagulation, but I agree we need to  evaluate further since a small lesion would probably not require holding his  anticoagulation for long, whereas a much larger lesion would make it more  difficult.  I have discussed this with the patient and with Dr. Laneta Simmers and  we will try to go ahead as planned.   PLAN:  I will schedule the patient for endoscopy, this will be done at  approximately 11:30 tomorrow.  I have discussed the procedure with he and  his family here today.           ______________________________  Llana Aliment. Malon Kindle., M.D.      Waldron Session  D:  09/16/2005  T:  09/16/2005  Job:  [id number]   cc:   Evelene Croon, M.D.  [address]  [place]  [place] [address]   Lacretia Nicks. Viann Fish, M.D.  Fax: [phone]  Email: [email or web address] .Remer Macho. Eloise Harman, M.D.  Fax: [phone]

## 2011-11-13 ENCOUNTER — Other Ambulatory Visit: Payer: Self-pay | Admitting: Cardiology

## 2012-09-24 ENCOUNTER — Other Ambulatory Visit: Payer: Self-pay | Admitting: Ophthalmology

## 2013-04-13 ENCOUNTER — Encounter: Payer: Self-pay | Admitting: Internal Medicine

## 2013-06-30 ENCOUNTER — Encounter: Payer: Self-pay | Admitting: Internal Medicine

## 2013-10-25 ENCOUNTER — Other Ambulatory Visit: Payer: Self-pay | Admitting: Cardiology

## 2013-10-25 ENCOUNTER — Encounter (HOSPITAL_COMMUNITY): Payer: Self-pay

## 2013-10-25 ENCOUNTER — Encounter: Payer: Self-pay | Admitting: Cardiology

## 2013-10-25 ENCOUNTER — Inpatient Hospital Stay (HOSPITAL_COMMUNITY): Payer: Medicare PPO

## 2013-10-25 ENCOUNTER — Inpatient Hospital Stay (HOSPITAL_COMMUNITY)
Admission: AD | Admit: 2013-10-25 | Discharge: 2013-11-01 | DRG: 247 | Disposition: A | Discharge status: Home / Self Care | Payer: Medicare PPO | Source: Ambulatory Visit | Attending: Cardiology | Admitting: Cardiology

## 2013-10-25 DIAGNOSIS — Z87891 Personal history of nicotine dependence: Secondary | ICD-10-CM

## 2013-10-25 DIAGNOSIS — I509 Heart failure, unspecified: Secondary | ICD-10-CM | POA: Diagnosis present

## 2013-10-25 DIAGNOSIS — Z8249 Family history of ischemic heart disease and other diseases of the circulatory system: Secondary | ICD-10-CM

## 2013-10-25 DIAGNOSIS — I25810 Atherosclerosis of coronary artery bypass graft(s) without angina pectoris: Secondary | ICD-10-CM | POA: Diagnosis present

## 2013-10-25 DIAGNOSIS — I2510 Atherosclerotic heart disease of native coronary artery without angina pectoris: Secondary | ICD-10-CM

## 2013-10-25 DIAGNOSIS — N390 Urinary tract infection, site not specified: Secondary | ICD-10-CM | POA: Diagnosis not present

## 2013-10-25 DIAGNOSIS — I200 Unstable angina: Secondary | ICD-10-CM | POA: Insufficient documentation

## 2013-10-25 DIAGNOSIS — Z833 Family history of diabetes mellitus: Secondary | ICD-10-CM

## 2013-10-25 DIAGNOSIS — I739 Peripheral vascular disease, unspecified: Secondary | ICD-10-CM

## 2013-10-25 DIAGNOSIS — Z79899 Other long term (current) drug therapy: Secondary | ICD-10-CM

## 2013-10-25 DIAGNOSIS — I779 Disorder of arteries and arterioles, unspecified: Secondary | ICD-10-CM

## 2013-10-25 DIAGNOSIS — K449 Diaphragmatic hernia without obstruction or gangrene: Secondary | ICD-10-CM | POA: Diagnosis present

## 2013-10-25 DIAGNOSIS — Z8719 Personal history of other diseases of the digestive system: Secondary | ICD-10-CM | POA: Insufficient documentation

## 2013-10-25 DIAGNOSIS — A498 Other bacterial infections of unspecified site: Secondary | ICD-10-CM | POA: Diagnosis not present

## 2013-10-25 DIAGNOSIS — E119 Type 2 diabetes mellitus without complications: Secondary | ICD-10-CM

## 2013-10-25 DIAGNOSIS — D1800 Hemangioma unspecified site: Secondary | ICD-10-CM | POA: Diagnosis present

## 2013-10-25 DIAGNOSIS — I2582 Chronic total occlusion of coronary artery: Secondary | ICD-10-CM | POA: Diagnosis present

## 2013-10-25 DIAGNOSIS — Y832 Surgical operation with anastomosis, bypass or graft as the cause of abnormal reaction of the patient, or of later complication, without mention of misadventure at the time of the procedure: Secondary | ICD-10-CM | POA: Diagnosis present

## 2013-10-25 DIAGNOSIS — I480 Paroxysmal atrial fibrillation: Secondary | ICD-10-CM

## 2013-10-25 DIAGNOSIS — Z888 Allergy status to other drugs, medicaments and biological substances status: Secondary | ICD-10-CM

## 2013-10-25 DIAGNOSIS — I219 Acute myocardial infarction, unspecified: Secondary | ICD-10-CM

## 2013-10-25 DIAGNOSIS — E785 Hyperlipidemia, unspecified: Secondary | ICD-10-CM | POA: Diagnosis present

## 2013-10-25 DIAGNOSIS — Z7982 Long term (current) use of aspirin: Secondary | ICD-10-CM

## 2013-10-25 DIAGNOSIS — Z6841 Body Mass Index (BMI) 40.0 and over, adult: Secondary | ICD-10-CM

## 2013-10-25 DIAGNOSIS — I119 Hypertensive heart disease without heart failure: Secondary | ICD-10-CM | POA: Diagnosis present

## 2013-10-25 DIAGNOSIS — I5032 Chronic diastolic (congestive) heart failure: Secondary | ICD-10-CM | POA: Diagnosis present

## 2013-10-25 DIAGNOSIS — T82897A Other specified complication of cardiac prosthetic devices, implants and grafts, initial encounter: Principal | ICD-10-CM | POA: Diagnosis present

## 2013-10-25 HISTORY — DX: Acute myocardial infarction, unspecified: I21.9

## 2013-10-25 HISTORY — DX: Hyperlipidemia, unspecified: E78.5

## 2013-10-25 LAB — CBC
HEMATOCRIT: 44.5 % (ref 39.0–52.0)
Hemoglobin: 15.1 g/dL (ref 13.0–17.0)
MCH: 29.1 pg (ref 26.0–34.0)
MCHC: 33.9 g/dL (ref 30.0–36.0)
MCV: 85.7 fL (ref 78.0–100.0)
PLATELETS: 163 10*3/uL (ref 150–400)
RBC: 5.19 MIL/uL (ref 4.22–5.81)
RDW: 13.7 % (ref 11.5–15.5)
WBC: 8 10*3/uL (ref 4.0–10.5)

## 2013-10-25 LAB — COMPREHENSIVE METABOLIC PANEL
ALT: 18 U/L (ref 0–53)
AST: 20 U/L (ref 0–37)
Albumin: 3.7 g/dL (ref 3.5–5.2)
Alkaline Phosphatase: 61 U/L (ref 39–117)
BUN: 19 mg/dL (ref 6–23)
CO2: 23 meq/L (ref 19–32)
CREATININE: 0.95 mg/dL (ref 0.50–1.35)
Calcium: 9.6 mg/dL (ref 8.4–10.5)
Chloride: 101 mEq/L (ref 96–112)
GFR calc Af Amer: >90 mL/min (ref >90)
GFR, EST NON AFRICAN AMERICAN: 85 mL/min — AB (ref >90)
GLUCOSE: 129 mg/dL — AB (ref 70–99)
Potassium: 4 mEq/L (ref 3.7–5.3)
Sodium: 139 mEq/L (ref 137–147)
Total Bilirubin: 0.8 mg/dL (ref 0.3–1.2)
Total Protein: 7.1 g/dL (ref 6.0–8.3)

## 2013-10-25 LAB — LIPID PANEL
CHOLESTEROL: 201 mg/dL — AB (ref 0–200)
HDL: 51 mg/dL (ref >39)
LDL Cholesterol: 108 mg/dL — ABNORMAL HIGH (ref 0–99)
Total CHOL/HDL Ratio: 3.9 RATIO
Triglycerides: 210 mg/dL — ABNORMAL HIGH (ref <150)
VLDL: 42 mg/dL — ABNORMAL HIGH (ref 0–40)

## 2013-10-25 LAB — HEMOGLOBIN A1C
HEMOGLOBIN A1C: 5.9 % — AB (ref <5.7)
MEAN PLASMA GLUCOSE: 123 mg/dL — AB (ref <117)

## 2013-10-25 LAB — TSH: TSH: 1.3 u[IU]/mL (ref 0.350–4.500)

## 2013-10-25 LAB — PROTIME-INR
INR: 1.01 (ref 0.00–1.49)
Prothrombin Time: 13.1 seconds (ref 11.6–15.2)

## 2013-10-25 LAB — TROPONIN I
Troponin I: <0.3 ng/mL (ref <0.30)
Troponin I: 0.36 ng/mL (ref <0.30)

## 2013-10-25 LAB — APTT: aPTT: 29 seconds (ref 24–37)

## 2013-10-25 MED ORDER — SODIUM CHLORIDE 0.9 % IJ SOLN: 3.0000 mL | Freq: Two times a day (BID) | INTRAMUSCULAR | Status: DC

## 2013-10-25 MED ORDER — SODIUM CHLORIDE 0.9 % IV SOLN: 250.0000 mL | INTRAVENOUS | Status: DC | PRN

## 2013-10-25 MED ORDER — DIAZEPAM 5 MG PO TABS: 10.0000 mg | ORAL_TABLET | ORAL | Status: DC

## 2013-10-25 MED ORDER — AMLODIPINE BESYLATE 5 MG PO TABS
5.0000 mg | ORAL_TABLET | Freq: Every morning | ORAL | Status: DC
  Administered 2013-10-26 – 2013-11-01 (×7): 5 mg via ORAL
  Filled 2013-10-25 (×7): qty 1

## 2013-10-25 MED ORDER — SODIUM CHLORIDE 0.9 % IJ SOLN: 3.0000 mL | INTRAMUSCULAR | Status: DC | PRN

## 2013-10-25 MED ORDER — NITROGLYCERIN 0.4 MG SL SUBL: 0.4000 mg | SUBLINGUAL_TABLET | SUBLINGUAL | Status: DC | PRN

## 2013-10-25 MED ORDER — ASPIRIN 81 MG PO CHEW
81.0000 mg | CHEWABLE_TABLET | ORAL | Status: AC
  Administered 2013-10-26: 81 mg via ORAL
  Filled 2013-10-25: qty 1

## 2013-10-25 MED ORDER — HEPARIN BOLUS VIA INFUSION
4000.0000 [IU] | Freq: Once | INTRAVENOUS | Status: AC
  Administered 2013-10-25: 4000 [IU] via INTRAVENOUS
  Filled 2013-10-25: qty 4000

## 2013-10-25 MED ORDER — TRIAMTERENE-HCTZ 37.5-25 MG PO TABS
0.5000 | ORAL_TABLET | Freq: Every morning | ORAL | Status: DC
  Administered 2013-10-26 – 2013-11-01 (×7): 0.5 via ORAL
  Filled 2013-10-25 (×7): qty 0.5

## 2013-10-25 MED ORDER — METOPROLOL SUCCINATE ER 100 MG PO TB24
100.0000 mg | ORAL_TABLET | Freq: Every day | ORAL | Status: DC
  Administered 2013-10-26 – 2013-11-01 (×7): 100 mg via ORAL
  Filled 2013-10-25 (×8): qty 1

## 2013-10-25 MED ORDER — HEPARIN (PORCINE) IN NACL 100-0.45 UNIT/ML-% IJ SOLN
1750.0000 [IU]/h | INTRAMUSCULAR | Status: DC
Start: 2013-10-25 — End: 2013-10-26
  Administered 2013-10-25: 1400 [IU]/h via INTRAVENOUS
  Administered 2013-10-26 (×2): 1750 [IU]/h via INTRAVENOUS
  Filled 2013-10-25 (×3): qty 250

## 2013-10-25 MED ORDER — IRBESARTAN 300 MG PO TABS
300.0000 mg | ORAL_TABLET | Freq: Every day | ORAL | Status: DC
  Administered 2013-10-26 – 2013-11-01 (×6): 300 mg via ORAL
  Filled 2013-10-25 (×9): qty 1

## 2013-10-25 MED ORDER — INSULIN ASPART 100 UNIT/ML ~~LOC~~ SOLN: 0.0000 [IU] | SUBCUTANEOUS | Status: DC

## 2013-10-25 MED ORDER — ACETAMINOPHEN 325 MG PO TABS: 650.0000 mg | ORAL_TABLET | ORAL | Status: DC | PRN

## 2013-10-25 MED ORDER — FLUTICASONE PROPIONATE 50 MCG/ACT NA SUSP
2.0000 | Freq: Every day | NASAL | Status: DC
  Filled 2013-10-25: qty 16

## 2013-10-25 MED ORDER — LORATADINE 10 MG PO TABS
10.0000 mg | ORAL_TABLET | Freq: Every day | ORAL | Status: DC | PRN
  Filled 2013-10-25: qty 1

## 2013-10-25 MED ORDER — SODIUM CHLORIDE 0.9 % IV SOLN
INTRAVENOUS | Status: DC
  Administered 2013-10-26: via INTRAVENOUS

## 2013-10-25 MED ORDER — METFORMIN HCL 500 MG PO TABS
1000.0000 mg | ORAL_TABLET | Freq: Two times a day (BID) | ORAL | Status: DC
  Filled 2013-10-25 (×2): qty 2

## 2013-10-25 MED ORDER — EZETIMIBE 10 MG PO TABS
10.0000 mg | ORAL_TABLET | Freq: Every day | ORAL | Status: DC
  Administered 2013-10-26 – 2013-11-01 (×7): 10 mg via ORAL
  Filled 2013-10-25 (×8): qty 1

## 2013-10-25 NOTE — Progress Notes (Signed)
ANTICOAGULATION CONSULT NOTE - Initial Consult  Pharmacy Consult for Heparin Indication: chest pain/ACS  Allergies  Allergen Reactions  . Ace Inhibitors Cough  . Atorvastatin     Muscle aches   . Simvastatin     Muscle aches     Patient Measurements: Height: 5\' 9"  (175.3 cm) Weight: 308 lb 12.8 oz (140.071 kg) IBW/kg (Calculated) : 70.7 Heparin Dosing Weight: 104kg  Vital Signs: Temp: 98 F (36.7 C) (04/08 1311) Temp src: Oral (04/08 1311) BP: 126/66 mmHg (04/08 1311) Pulse Rate: 80 (04/08 1311)  Labs:  Recent Labs  10/25/13 1350  APTT 29  LABPROT 13.1  INR 1.01  CREATININE 0.95    Estimated Creatinine Clearance: 106.6 ml/min (by C-G formula based on Cr of 0.95).   Medical History: Past Medical History  Diagnosis Date  . CAD (coronary artery disease), native coronary artery     Cath Feb 2007 normal Left main, occluded LAD, 90% stenosis proximal CFX, occluded RCA, AM of RCA 70% CABG with LIMA to LAD, SVG to dx-OM, SVG to RCA 09/11/05 Dr. Cyndia Bent    . Morbid obesity   . Hypertensive heart disease   . Diabetes mellitus type 2, noninsulin dependent   . Carotid artery disease     Prior left CEA   . Hyperlipidemia 10/25/2013  . History of GI bleed     Medications:  See electronic med rec  Assessment: 67yom to start heparin for CP/USA. Patient reports no bleeding and not taking any anticoagulants. - Baseline INR 1.01  Goal of Therapy:  Heparin level 0.3-0.7 units/ml Monitor platelets by anticoagulation protocol: Yes   Plan:  1. Heparin IV bolus 4000 units x 1 2. Heparin drip 1400 units/hr (14 ml/hr) 3. Check heparin level 6 hours after initiation 4. Daily heparin level and CBC  Patsey Berthold [facility] [phone] 10/25/2013,3:39 PM

## 2013-10-25 NOTE — Progress Notes (Signed)
CRITICAL VALUE ALERT  Critical value received:  Troponin .36  Date of notification: 10/25/2013  Time of notification: 2244  Critical value read back:yes  Nurse who received alert:  Hadassah Pais RN   MD notified (1st page):  Dr Collins Scotland on call for Cardiology  Time of first page: 2245  MD notified (2nd page):  Time of second page:  Responding MD:  Dr. Collins Scotland   Time MD responded: 2249

## 2013-10-25 NOTE — H&P (Addendum)
Ronnie Myers  Date of visit:  10/25/2013 DOB:  07/30/1945    Age:  68 yrs. Medical record number:  [id number] Account number:  [id number] Primary Care Provider: Donnajean Lopes ____________________________ CURRENT DIAGNOSES  1. Unstable angina  2. CAD, Native  3. Hypertensive Heart Disease-Benign without CHF  4. MI-S/P Anterior  5. Hyperlipidemia  6. Carotid Artery Stenosis  7. Personal History Of Peptic Ulcer Disease  8. Surgery-Aortocoronary Bypass Grafting  9. Long Term Use of Other Medicine  10. Obesity, morbid (BMI>40)  11. Diabetes Mellitus-NIDD ____________________________ ALLERGIES  Ace Inhibitors, Intolerance-cough  Atorvastatin, Muscle aches  Simvastatin, Muscle aches ____________________________ MEDICATIONS  1. Fish Oil 300-1,000 mg capsule, 1 p.o. daily  2. folic acid 1 mg tablet, 1 p.o. daily  3. metformin 1,000 mg tablet, BID  4. Zetia 10 mg Tablet, 1 p.o. q.d.  5. metoprolol succinate 100 mg Tablet Extended Release 24 hr, 1 p.o. daily  6. Micardis 80 mg Tablet, 1 p.o. daily  7. Crestor 20 mg tablet, 1 p.o. daily  8. nitroglycerin 0.4 mg tablet, sublingual, PRN  9. aspirin 81 mg chewable tablet, 1 p.o. daily  10. Flonase Allergy Relief 50 mcg/actuation nasal spray,suspension, 2 spray qd  11. Claritin 10 mg tablet, 1 p.o. daily ____________________________ CHIEF COMPLAINTS  Chest and jaw pain with exertion ____________________________ HISTORY OF PRESENT ILLNESS  68 year old male admitted to the hospital for treatment of unstable angina. The patient has a history of coronary bypass grafting in 2007 for significant angina for 3 vessel disease. He has done well since then and has been seen annually. He also had a previous carotid endarterectomy. I saw him in the office just last Thursday for routine annual followup and he was doing fine at that time. He went to the beach over the weekend but had significant fatigue and malaise and did not feel well. He saw his  primary doctor on Monday for complaints of upper respiratory congestion and allergy. Yesterday while walking he had the onset of midsternal chest discomfort suggestive of angina with radiation to his neck and to both shoulders. The discomfort lasted 5-10 minutes and he then noted spontaneous recovery. Last night he had an episode that occurred at rest of similar discomfort and was seen at the office today. He has not had further discomfort since last evening. This is the first episode of discomfort he has had since his previous surgery. He denies PND, orthopnea or edema. He does complain of a very mild amount of abdominal pain.  ____________________________ PAST HISTORY  Past Medical Illnesses:  hypertension, hyperlipidemia, morbid obesity, hiatal hernia, GI bleed from gastric ulcer, DM-non-insulin dependent;  Cardiovascular Illnesses:  Carotid artery disease, CAD;  Surgical Procedures:  carotid endarterectomy, umbilical hernia, cataract extraction, CABG with LIMA to LAD, SVG to dx-OM, SVG to RCA 09/11/05 Dr. Cyndia Bent;  Cardiology Procedures-Invasive:  cardiac cath (left) February 2007;  Cardiology Procedures-Noninvasive:  echocardiogram July 2003;  Cardiac Cath Results:  normal Left main, occluded LAD, 90% stenosis proximal CFX, occluded RCA, AM of RCA 70%;  LVEF of 55% documented via echocardiogram on 08/24/2005,   ____________________________ CARDIO-PULMONARY TEST DATES EKG Date:  10/21/2012;   Cardiac Cath Date:  08/24/2005;  CABG: 09/11/2005;  Echocardiography Date: 02/07/2002;  Chest Xray Date: 08/24/2005;   ____________________________ FAMILY HISTORY Father -- Father dead, Myocardial infarction Mother -- Mother alive and well Sister -- Sister alive with problem, Diabetes mellitus ___________________________ SOCIAL HISTORY Alcohol Use:  no alcohol use;  Smoking:  used to smoke but quit  1995;  Diet:  low carb;  Lifestyle:  married;  Exercise:  exercises daily;  Occupation:  Animal nutritionist and  [employer];  Residence:  lives with wife;   ____________________________ REVIEW OF SYSTEMS General:  malaise and fatigue  Integumentary:no rashes or new skin lesions. Eyes: wears eye glasses/contact lenses, cataract extraction bilaterally Ears, Nose, Throat, Mouth:  denies any hearing loss, epistaxis, hoarseness or difficulty speaking. Respiratory: denies dyspnea, cough, wheezing or hemoptysis. Cardiovascular:  please review HPI Abdominal: denies dyspepsia, GI bleeding, constipation, or diarrhea Genitourinary-Male: nocturia  Musculoskeletal:  chronic low back pain, arthritis of the shoulder Neurological:  denies headaches, stroke, or TIA Hematological/Immunologic:  seasonal allergies ____________________________ PHYSICAL EXAMINATION VITAL SIGNS  Blood Pressure:  146/70 Sitting, Left arm, large cuff  , 142/64 Standing, Left arm and large cuff   Pulse:  80/min. Weight:  314.00 lbs. Height:  69"BMI: 46  Constitutional:  pleasant white male in no acute distress, severely obese, appears older than stated age Skin:  warm and dry to touch, no apparent skin lesions, or masses noted. Head:  normocephalic, normal hair pattern, no masses or tenderness Eyes:  EOMS Intact, PERRLA, C and S clear, Funduscopic exam not done. ENT:  ears, nose and throat unremarkable Neck:  no JVD, no bruits, no masses, non-tender, healed left carotid endarterectomy scar Chest:  normal symmetry, clear to auscultation., healed median sternotomy scar Cardiac:  regular rhythm, normal S1 and S2, No S3 or S4, no murmurs, gallops or rubs detected. Abdomen:  abdomen soft,non-tender, no masses, no hepatospenomegaly, or aneurysm noted Peripheral Pulses:  the femoral,dorsalis pedis, and posterior tibial pulses are full and equal bilaterally with no bruits auscultated. Extremities & Back:  bilateral venous insufficiency changes present, no edema present, well healed saphenous vein donor site RLE, well healed saphenous vein donor site  LLE Neurological:  no gross motor or sensory deficits noted, affect appropriate, oriented x3. ____________________________ MOST RECENT LIPID PANEL 04/06/13  CHOL TOTL 245 mg/dl, LDL 167 NM, HDL 49 mg/dl, TRIGLYCER 146 mg/dl and CHOL/HDL 5.0 (Calc) ____________________________ IMPRESSIONS/PLAN  1. Unstable angina 2. Morbid obesity 3. Coronary artery disease with previous bypass grafting for three-vessel disease 4. Hypertension 5. Hyperlipidemia under treatment 6. History of carotid artery disease with endarterectomy 7. History of GI bleed from gastric ulcer in the past 8. Diabetes mellitus  Recommendations:  He will be placed on intravenous heparin. Lab work done. Likely will need repeat catheterization done either through the left radial approach or with a femoral approach. I will be away the next few days and [facility] cardiology will be seeing the patient in my absence. Cardiac catheterization was discussed with the patient including risks of myocardial infarction, death, stroke, bleeding, arrhythmia, dye allergy, or renal insufficiency. He understands and is willing to proceed. Possibility of percutaneous intervention at the same setting was also discussed with the patient including risks.                      ____________________________ Cardiology Physician:  Kerry Hough MD [facility]

## 2013-10-25 NOTE — Interval H&P Note (Signed)
Cath Lab Visit (complete for each Cath Lab visit)  Clinical Evaluation Leading to the Procedure:   ACS: no  Non-ACS:    Anginal Classification: CCS IV  Anti-ischemic medical therapy: Minimal Therapy (1 class of medications)  Non-Invasive Test Results: No non-invasive testing performed  Prior CABG: Previous CABG      History and Physical Interval Note:  10/25/2013 5:54 PM  Ronnie Myers  has presented today for surgery, with the diagnosis of CP  The various methods of treatment have been discussed with the patient and family. After consideration of risks, benefits and other options for treatment, the patient has consented to  Procedure(s): LEFT HEART CATHETERIZATION WITH CORONARY ANGIOGRAM (N/A) as a surgical intervention .  The patient's history has been reviewed, patient examined, no change in status, stable for surgery.  I have reviewed the patient's chart and labs.  Questions were answered to the patient's satisfaction.     Belva Crome III

## 2013-10-25 NOTE — Interval H&P Note (Signed)
History and Physical Interval Note:  10/25/2013 5:52 PM Chart reviewed and Ronnie Myers was interviewed and examined. Ronnie Myers willing to proceed with coronary angiography. [name]  has presented today for surgery, with Ronnie diagnosis of CP  Ronnie various methods of treatment have been discussed with Ronnie Myers and family. After consideration of risks, benefits and other options for treatment, Ronnie Myers has consented to  Procedure(s): LEFT HEART CATHETERIZATION WITH CORONARY ANGIOGRAM (N/A) as a surgical intervention .  Ronnie Myers's history has been reviewed, Myers examined, no change in status, stable for surgery.  I have reviewed Ronnie Myers's chart and labs.  Questions were answered to Ronnie Myers's satisfaction.     Belva Crome III

## 2013-10-26 ENCOUNTER — Encounter (HOSPITAL_COMMUNITY)
Admission: AD | Disposition: A | Discharge status: Home / Self Care | Payer: Self-pay | Source: Ambulatory Visit | Attending: Cardiology

## 2013-10-26 ENCOUNTER — Other Ambulatory Visit: Payer: Self-pay | Admitting: *Deleted

## 2013-10-26 ENCOUNTER — Other Ambulatory Visit: Payer: Self-pay

## 2013-10-26 ENCOUNTER — Encounter (HOSPITAL_COMMUNITY): Payer: Self-pay | Admitting: Thoracic Surgery (Cardiothoracic Vascular Surgery)

## 2013-10-26 ENCOUNTER — Ambulatory Visit (HOSPITAL_COMMUNITY): Admission: RE | Admit: 2013-10-26 | Payer: Medicare PPO | Source: Ambulatory Visit | Admitting: Cardiology

## 2013-10-26 ENCOUNTER — Encounter (HOSPITAL_COMMUNITY): Admission: RE | Payer: Self-pay | Source: Ambulatory Visit

## 2013-10-26 DIAGNOSIS — Z01810 Encounter for preprocedural cardiovascular examination: Secondary | ICD-10-CM

## 2013-10-26 DIAGNOSIS — I2510 Atherosclerotic heart disease of native coronary artery without angina pectoris: Secondary | ICD-10-CM

## 2013-10-26 DIAGNOSIS — I200 Unstable angina: Secondary | ICD-10-CM

## 2013-10-26 HISTORY — PX: LEFT HEART CATHETERIZATION WITH CORONARY ANGIOGRAM: SHX5451

## 2013-10-26 LAB — GLUCOSE, CAPILLARY
GLUCOSE-CAPILLARY: 109 mg/dL — AB (ref 70–99)
GLUCOSE-CAPILLARY: 103 mg/dL — AB (ref 70–99)
Glucose-Capillary: 120 mg/dL — ABNORMAL HIGH (ref 70–99)
Glucose-Capillary: 142 mg/dL — ABNORMAL HIGH (ref 70–99)

## 2013-10-26 LAB — CBC
HCT: 45 % (ref 39.0–52.0)
Hemoglobin: 15.1 g/dL (ref 13.0–17.0)
MCH: 28.7 pg (ref 26.0–34.0)
MCHC: 33.6 g/dL (ref 30.0–36.0)
MCV: 85.6 fL (ref 78.0–100.0)
Platelets: 149 10*3/uL — ABNORMAL LOW (ref 150–400)
RBC: 5.26 MIL/uL (ref 4.22–5.81)
RDW: 13.6 % (ref 11.5–15.5)
WBC: 7.5 10*3/uL (ref 4.0–10.5)

## 2013-10-26 LAB — HEPARIN LEVEL (UNFRACTIONATED)
HEPARIN UNFRACTIONATED: 0.11 [IU]/mL — AB (ref 0.30–0.70)
Heparin Unfractionated: 0.36 IU/mL (ref 0.30–0.70)

## 2013-10-26 LAB — TROPONIN I

## 2013-10-26 SURGERY — LEFT HEART CATHETERIZATION WITH CORONARY ANGIOGRAM
Anesthesia: LOCAL

## 2013-10-26 MED ORDER — INSULIN ASPART 100 UNIT/ML ~~LOC~~ SOLN
0.0000 [IU] | Freq: Three times a day (TID) | SUBCUTANEOUS | Status: DC
  Administered 2013-10-30 (×2): 1 [IU] via SUBCUTANEOUS

## 2013-10-26 MED ORDER — HEPARIN (PORCINE) IN NACL 100-0.45 UNIT/ML-% IJ SOLN
1950.0000 [IU]/h | INTRAMUSCULAR | Status: DC
  Administered 2013-10-26 – 2013-10-28 (×2): 1750 [IU]/h via INTRAVENOUS
  Filled 2013-10-26 (×5): qty 250

## 2013-10-26 MED ORDER — HEPARIN BOLUS VIA INFUSION
2000.0000 [IU] | Freq: Once | INTRAVENOUS | Status: AC
Start: 2013-10-26 — End: 2013-10-26
  Administered 2013-10-26: 2000 [IU] via INTRAVENOUS
  Filled 2013-10-26: qty 2000

## 2013-10-26 MED ORDER — ZOLPIDEM TARTRATE 5 MG PO TABS
5.0000 mg | ORAL_TABLET | Freq: Every evening | ORAL | Status: DC | PRN
  Administered 2013-10-26 – 2013-10-30 (×5): 5 mg via ORAL
  Filled 2013-10-26 (×5): qty 1

## 2013-10-26 MED ORDER — SODIUM CHLORIDE 0.9 % IV SOLN: INTRAVENOUS | Status: DC

## 2013-10-26 NOTE — Progress Notes (Addendum)
VASCULAR LAB PRELIMINARY  PRELIMINARY  PRELIMINARY  PRELIMINARY  Pre-op Cardiac Surgery  Carotid Findings:  Right:  60-79% internal carotid artery stenosis.  Left:  1-39% ICA stenosis.  Bilateral:  Vertebral artery flow is antegrade.       Upper Extremity Right Left  Brachial Pressures 142 130  Radial Waveforms Bi Tri  Ulnar Waveforms Bi Bi  Palmar Arch (Allen's Test) Normal with radial and ulnar compression Decreases >50% with radial and ulnar compression   Findings:  Palpable pedal pulses.    Charlaine Dalton, RVT Landry Mellow, RDMS, RVT 10/26/2013, 4:24 PM

## 2013-10-26 NOTE — Progress Notes (Signed)
ANTICOAGULATION CONSULT NOTE - Follow Up Consult  Pharmacy Consult for heparin Indication: CAD  Allergies  Allergen Reactions  . Simvastatin Other (See Comments)    Muscle aches and pains   . Ace Inhibitors Cough  . Atorvastatin Other (See Comments)    Muscle aches and pains     Patient Measurements: Height: 5\' 9"  (175.3 cm) Weight: 308 lb 2.8 oz (139.788 kg) IBW/kg (Calculated) : 70.7 Heparin Dosing Weight: 104 kg  Vital Signs: Temp: 98 F (36.7 C) (04/09 0904) Temp src: Oral (04/09 0904) BP: 152/65 mmHg (04/09 0904) Pulse Rate: 84 (04/09 0904)  Labs:  Recent Labs  10/25/13 1350 10/25/13 1419 10/25/13 2154 10/26/13 0209  HGB  --  15.1  --  15.1  HCT  --  44.5  --  45.0  PLT  --  163  --  149*  APTT 29  --   --   --   LABPROT 13.1  --   --   --   INR 1.01  --   --   --   HEPARINUNFRC  --   --   --  0.11*  CREATININE 0.95  --   --   --   TROPONINI <0.30  --  0.36* <0.30    Estimated Creatinine Clearance: 106.3 ml/min (by C-G formula based on Cr of 0.95).  Assessment: Patient is a 68 y.o M s/p cath this morning with noted "bypass graft failure."  Plan to consult CVTS.  To resume heparin therapy 8 hours post sheath removal.  Sheath removed at 0845 this morning.  Goal of Therapy:  Heparin level 0.3-0.7 units/ml Monitor platelets by anticoagulation protocol: Yes   Plan:  1) resume heparin drip at 1750 units/hr at 5 PM today 2) check 6 hour heparin level  Clinton Dragone P Liza Czerwinski 10/26/2013,9:45 AM

## 2013-10-26 NOTE — Progress Notes (Signed)
1:06 AM   Heparin results from 2100 did not result due to hemolysis of sample. Lab attempted a second draw but pt is a difficult stick resulting in no blood drawn. Lab is sending another technician to floor to attempt a 3rd try. Heparin infusion since 1530 yesterday. Approaching 12 hours with no labs. Troponin 0.36.  Curlene Dolphin

## 2013-10-26 NOTE — CV Procedure (Addendum)
     Left Heart Catheterization with Coronary Angiography and Bypass Angiography Report  Ronnie Myers  68 y.o.  male Dec 18, 1945  Procedure Date: 10/26/2013 Referring Physician: Tollie Eth, M.D. Primary Cardiologist:: Tollie Eth, M.D.  INDICATIONS: Unstable angina pectoris  PROCEDURE: 1. Left heart catheterization; 2. Coronary angiography; 3. Bypass graft angiography including left internal mammary angioma; 4. Left ventriculography  CONSENT:  The risks, benefits, and details of the procedure were explained in detail to the patient. Risks including death, stroke, heart attack, kidney injury, allergy, limb ischemia, bleeding and radiation injury were discussed.  The patient verbalized understanding and wanted to proceed.  Informed written consent was obtained.  PROCEDURE TECHNIQUE:  After Xylocaine anesthesia a 5 French Slender sheath was placed in the left radial artery with an angiocath and the modified Seldinger technique.  Coronary angiography was done using a 5 F JL 3.5, JR 4, and A2 MP diagnostic catheters.  Left ventriculography was done using the JR 4 catheter and hand injection.   Digital images were reviewed. The case was terminated post-angiography.  A wrist band was used to achieve hemostasis.   CONTRAST:  Total of 150 cc.  COMPLICATIONS:  None   HEMODYNAMICS:  Aortic pressure 138/80 mmHg; LV pressure 142/12; LVEDP 26 mm mercury  ANGIOGRAPHIC DATA:   The left main coronary artery is patent and heavily calcified..  The left anterior descending artery is heavily calcified and totally occluded in the mid vessel.  The left circumflex artery is patent. The large second obtuse marginal is totally occluded with faint distal opacification by left to left collaterals. The bypass graft to this obtuse marginal is occluded. The continuation is patent with severe distal disease before 2 small obtuse marginals..  The right coronary artery is totally occluded  proximally.  BYPASS GRAFT ANGIOGRAPHY:  The saphenous vein sequential graft to the diagonal and obtuse marginal: The graft is widely patent and supplies a first diagonal which retrogradely fills the LAD. The continuation of the graft beyond the diagonal anastomosis is totally occluded and this appears relatively fresh.  The saphenous vein graft to the distal right coronary is severely, segmentally and diffusely diseased from the ostium to the proximal segment with 99% focal stenosis in the distal portion of the segmental stenosis. The right coronary is diffusely diseased but patent including a left ventricular branch fills retrogradely from the PDA.  LIMA. the LAD: Totally occluded   LEFT VENTRICULOGRAM:  Left ventricular angiogram was done in the 30 RAO projection and revealed reveals inferobasal moderate hypokinesis. Ejection fraction is 50%.   IMPRESSIONS:  1. Bypass graft failure with occlusion of the saphenous vein graft to the obtuse marginal but patent proximal anastomosis to the diagonal. The saphenous vein graft to the right coronary contains a proximal diffuse high-grade obstruction.  2. Totally occluded LIMA to LAD  3. Totally occluded mid LAD, proximal RCA, and second obtuse marginal. There is severe diffuse disease in the distal native circumflex.  4. Inferobasal moderate hypokinesis with estimated ejection fraction of 50% and elevated end-diastolic pressure compatible with chronic diastolic heart failure  RECOMMENDATION:  Complex situation with relatively poor epicardial targets. The patient has bypass graft failure. He should be considered for repeat surgical revascularization. A bail out approaches surgery is now possible would be stenting of the right coronary saphenous vein graft.. I was unable to investigate the [place] because of the approach from the left radial.

## 2013-10-26 NOTE — Consult Note (Signed)
Reason for Consult:Severe native and graft 3 vessel CA Referring Physician: Dr. Dondra Prader  Ronnie Myers is an 68 y.o. male.  HPI: 67 yo man with known CAD, who had a CABG x 4 by [facility] in 2007. He also has diabetes, hypertension, ECCOD with a prio left CEA, hyperlipidemia and obesity. He had done well from a cardiac standpoint from the time of his bypass until about a week ago. Over the weekend he was walking at the beach and experienced severe malaise and fatigue. The day prior to admission he had an episode of burning pain in his chest radiating to his jaw and shoulders. The pain lasted 5-10 minutes and was relieved with rest. The night prior to admission he had another episode while at rest. He went to Dr. Thurman Coyer office and was admitted.   He had a mildly elevated troponin at 0.36.  Today he had cardiac catheterization. It showed severe native and graft 3 vessel CAD. The LIMA graft is occluded. There is a sequential vein to a diagonal and OM that is occluded past the diagonal anastomosis. The SVG to his right system has a tight stenosis in its midportion and has moderate disease proximally.   Past Medical History  Diagnosis Date  . CAD (coronary artery disease), native coronary artery     Cath Feb 2007 normal Left main, occluded LAD, 90% stenosis proximal CFX, occluded RCA, AM of RCA 70% CABG with LIMA to LAD, SVG to dx-OM, SVG to RCA 09/11/05 Dr. Cyndia Bent    . Morbid obesity   . Hypertensive heart disease   . Diabetes mellitus type 2, noninsulin dependent   . Carotid artery disease     Prior left CEA   . Hyperlipidemia 10/25/2013  . History of GI bleed     Past Surgical History  Procedure Laterality Date  . Coronary artery bypass graft  09/11/05  . Carotid endarterectomy  02/24/02  . Umbilical hernia repair    . Cataract extraction      Family History  Problem Relation Age of Onset  . CAD Father   . Diabetes Sister     Social History:  reports that he has quit smoking. He has  never used smokeless tobacco. He reports that he does not drink alcohol or use illicit drugs.  Allergies:  Allergies  Allergen Reactions  . Simvastatin Other (See Comments)    Muscle aches and pains   . Ace Inhibitors Cough  . Atorvastatin Other (See Comments)    Muscle aches and pains     Medications:  Prior to Admission:  Prescriptions prior to admission  Medication Sig Dispense Refill  . amLODipine (NORVASC) 5 MG tablet Take 5 mg by mouth every morning.      Marland Kitchen aspirin 81 MG chewable tablet Chew 81-162 mg by mouth 2 (two) times daily. 1 tablet in the morning and 2 tablets in the evening.      . fluticasone (FLONASE) 50 MCG/ACT nasal spray Place 2 sprays into both nostrils daily.      Marland Kitchen ibuprofen (ADVIL,MOTRIN) 200 MG tablet Take 200-600 mg by mouth daily as needed for moderate pain.       Marland Kitchen loratadine (CLARITIN) 10 MG tablet Take 10 mg by mouth daily as needed for allergies.      . Lorcaserin HCl (BELVIQ) 10 MG TABS Take 10 mg by mouth daily.       . metFORMIN (GLUCOPHAGE) 1000 MG tablet Take 500 mg by mouth 2 (two) times daily with  a meal.      . metoprolol (LOPRESSOR) 50 MG tablet Take 50 mg by mouth 2 (two) times daily.      . Omega-3 Fatty Acids (FISH OIL PO) Take 1 capsule by mouth at bedtime.      . rosuvastatin (CRESTOR) 20 MG tablet Take 20 mg by mouth every morning.       . triamterene-hydrochlorothiazide (MAXZIDE-25) 37.5-25 MG per tablet Take 0.5 tablets by mouth every morning.        Results for orders placed during the hospital encounter of 10/25/13 (from the past 48 hour(s))  APTT     Status: None   Collection Time    10/25/13  1:50 PM      Result Value Ref Range   aPTT 29  24 - 37 seconds  COMPREHENSIVE METABOLIC PANEL     Status: Abnormal   Collection Time    10/25/13  1:50 PM      Result Value Ref Range   Sodium 139  137 - 147 mEq/L   Potassium 4.0  3.7 - 5.3 mEq/L   Chloride 101  96 - 112 mEq/L   CO2 23  19 - 32 mEq/L   Glucose, Bld 129 (*) 70 - 99  mg/dL   BUN 19  6 - 23 mg/dL   Creatinine, Ser 0.95  0.50 - 1.35 mg/dL   Calcium 9.6  8.4 - 10.5 mg/dL   Total Protein 7.1  6.0 - 8.3 g/dL   Albumin 3.7  3.5 - 5.2 g/dL   AST 20  0 - 37 U/L   ALT 18  0 - 53 U/L   Alkaline Phosphatase 61  39 - 117 U/L   Total Bilirubin 0.8  0.3 - 1.2 mg/dL   GFR calc non Af Amer 85 (*) >90 mL/min   GFR calc Af Amer >90  >90 mL/min   Comment: (NOTE)     The eGFR has been calculated using the CKD EPI equation.     This calculation has not been validated in all clinical situations.     eGFR's persistently <90 mL/min signify possible Chronic Kidney     Disease.  HEMOGLOBIN A1C     Status: Abnormal   Collection Time    10/25/13  1:50 PM      Result Value Ref Range   Hemoglobin A1C 5.9 (*) <5.7 %   Comment: (NOTE)                                                                               According to the ADA Clinical Practice Recommendations for 2011, when     HbA1c is used as a screening test:      >=6.5%   Diagnostic of Diabetes Mellitus               (if abnormal result is confirmed)     5.7-6.4%   Increased risk of developing Diabetes Mellitus     References:Diagnosis and Classification of Diabetes Mellitus,Diabetes     SEGB,1517,61(YWVPX 1):S62-S69 and Standards of Medical Care in             Diabetes - 2011,Diabetes Care,2011,34 (Suppl 1):S11-S61.   Mean Plasma Glucose 123 (*) <  117 mg/dL   Comment: Performed at [facility]: Abnormal   Collection Time    10/25/13  1:50 PM      Result Value Ref Range   Cholesterol 201 (*) 0 - 200 mg/dL   Triglycerides 210 (*) <150 mg/dL   HDL 51  >39 mg/dL   Total CHOL/HDL Ratio 3.9     VLDL 42 (*) 0 - 40 mg/dL   LDL Cholesterol 108 (*) 0 - 99 mg/dL   Comment:            Total Cholesterol/HDL:CHD Risk     Coronary Heart Disease Risk Table                         Men   Women      1/2 Average Risk   3.4   3.3      Average Risk       5.0   4.4      2 X Average Risk   9.6    7.1      3 X Average Risk  23.4   11.0                Use the calculated Patient Ratio     above and the CHD Risk Table     to determine the patient's CHD Risk.                ATP III CLASSIFICATION (LDL):      <100     mg/dL   Optimal      100-129  mg/dL   Near or Above                        Optimal      130-159  mg/dL   Borderline      160-189  mg/dL   High      >190     mg/dL   Very High  PROTIME-INR     Status: None   Collection Time    10/25/13  1:50 PM      Result Value Ref Range   Prothrombin Time 13.1  11.6 - 15.2 seconds   INR 1.01  0.00 - 1.49  TROPONIN I     Status: None   Collection Time    10/25/13  1:50 PM      Result Value Ref Range   Troponin I <0.30  <0.30 ng/mL   Comment:            Due to the release kinetics of cTnI,     a negative result within the first hours     of the onset of symptoms does not rule out     myocardial infarction with certainty.     If myocardial infarction is still suspected,     repeat the test at appropriate intervals.  TSH     Status: None   Collection Time    10/25/13  1:50 PM      Result Value Ref Range   TSH 1.300  0.350 - 4.500 uIU/mL   Comment: Please note change in reference range.  CBC     Status: None   Collection Time    10/25/13  2:19 PM      Result Value Ref Range   WBC 8.0  4.0 - 10.5 K/uL   RBC 5.19  4.22 - 5.81 MIL/uL  Hemoglobin 15.1  13.0 - 17.0 g/dL   HCT 44.5  39.0 - 52.0 %   MCV 85.7  78.0 - 100.0 fL   MCH 29.1  26.0 - 34.0 pg   MCHC 33.9  30.0 - 36.0 g/dL   RDW 13.7  11.5 - 15.5 %   Platelets 163  150 - 400 K/uL  TROPONIN I     Status: Abnormal   Collection Time    10/25/13  9:54 PM      Result Value Ref Range   Troponin I 0.36 (*) <0.30 ng/mL   Comment:            Due to the release kinetics of cTnI,     a negative result within the first hours     of the onset of symptoms does not rule out     myocardial infarction with certainty.     If myocardial infarction is still suspected,      repeat the test at appropriate intervals.     CRITICAL RESULT CALLED TO, READ BACK BY AND VERIFIED WITH:     A. DUFFY (RN) [id number] 10/25/2013 L. LOMAX  GLUCOSE, CAPILLARY     Status: Abnormal   Collection Time    10/26/13 12:13 AM      Result Value Ref Range   Glucose-Capillary 120 (*) 70 - 99 mg/dL   Comment 1 Notify RN    TROPONIN I     Status: None   Collection Time    10/26/13  2:09 AM      Result Value Ref Range   Troponin I <0.30  <0.30 ng/mL   Comment:            Due to the release kinetics of cTnI,     a negative result within the first hours     of the onset of symptoms does not rule out     myocardial infarction with certainty.     If myocardial infarction is still suspected,     repeat the test at appropriate intervals.  HEPARIN LEVEL (UNFRACTIONATED)     Status: Abnormal   Collection Time    10/26/13  2:09 AM      Result Value Ref Range   Heparin Unfractionated 0.11 (*) 0.30 - 0.70 IU/mL   Comment:            IF HEPARIN RESULTS ARE BELOW     EXPECTED VALUES, AND PATIENT     DOSAGE HAS BEEN CONFIRMED,     SUGGEST FOLLOW UP TESTING     OF ANTITHROMBIN III LEVELS.  CBC     Status: Abnormal   Collection Time    10/26/13  2:09 AM      Result Value Ref Range   WBC 7.5  4.0 - 10.5 K/uL   RBC 5.26  4.22 - 5.81 MIL/uL   Hemoglobin 15.1  13.0 - 17.0 g/dL   HCT 45.0  39.0 - 52.0 %   MCV 85.6  78.0 - 100.0 fL   MCH 28.7  26.0 - 34.0 pg   MCHC 33.6  30.0 - 36.0 g/dL   RDW 13.6  11.5 - 15.5 %   Platelets 149 (*) 150 - 400 K/uL  GLUCOSE, CAPILLARY     Status: Abnormal   Collection Time    10/26/13  4:33 AM      Result Value Ref Range   Glucose-Capillary 109 (*) 70 - 99 mg/dL  GLUCOSE, CAPILLARY     Status: Abnormal  Collection Time    10/26/13 11:45 AM      Result Value Ref Range   Glucose-Capillary 142 (*) 70 - 99 mg/dL    Dg Chest 2 View  10/26/2013   CLINICAL DATA:  Unstable angina  EXAM: CHEST  2 VIEW  COMPARISON:  10/13/2005  FINDINGS: Previous median sternotomy  for CABG. No cardiomegaly. Stable mediastinal contours. Chronic interstitial coarsening without overt edema. No effusion or pneumothorax. No consolidation.  IMPRESSION: Stable exam.  No evidence of acute cardiopulmonary disease.   Electronically Signed   By: Jorje Guild M.D.   On: 10/26/2013 02:23    Review of Systems  Constitutional: Positive for malaise/fatigue. Negative for weight loss.  Eyes:       Bilateral cataract removal  Cardiovascular: Positive for chest pain.  Genitourinary:       Nocturia  Musculoskeletal: Positive for back pain.       Arthritis  Neurological: Negative.   Endo/Heme/Allergies: Positive for environmental allergies.  All other systems reviewed and are negative.  Blood pressure 118/74, pulse 74, temperature 98 F (36.7 C), temperature source Oral, resp. rate 18, height 5' 9"  (1.753 m), weight 308 lb 2.8 oz (139.788 kg), SpO2 93.00%. Physical Exam  Vitals reviewed. Constitutional: He is oriented to person, place, and time.  obese  HENT:  Head: Normocephalic and atraumatic.  Eyes: EOM are normal.  Neck: Neck supple. No thyromegaly present.  Well healed surgical scar  Cardiovascular: Normal rate, regular rhythm, normal heart sounds and intact distal pulses.   Palpable DP bilaterally Abnormal Allen's test on L, better refill on right  Respiratory: Breath sounds normal. He has no wheezes. He has no rales.  Well healed sternotomy incision  GI: Soft. There is no tenderness.  Musculoskeletal: He exhibits no edema.  Lymphadenopathy:    He has no cervical adenopathy.  Neurological: He is alert and oriented to person, place, and time.  No focal motor deficit  Skin: Skin is warm and dry.  Well healed incision right leg at knee    CARDIAC CATHETERIZATION HEMODYNAMICS: Aortic pressure 138/80 mmHg; LV pressure 142/12; LVEDP 26 mm mercury  ANGIOGRAPHIC DATA: The left main coronary artery is patent and heavily calcified..  The left anterior descending artery  is heavily calcified and totally occluded in the mid vessel.  The left circumflex artery is patent. The large second obtuse marginal is totally occluded with faint distal opacification by left to left collaterals. The bypass graft to this obtuse marginal is occluded. The continuation is patent with severe distal disease before 2 small obtuse marginals..  The right coronary artery is totally occluded proximally.  BYPASS GRAFT ANGIOGRAPHY:  The saphenous vein sequential graft to the diagonal and obtuse marginal: The graft is widely patent and supplies a first diagonal which retrogradely fills the LAD. The continuation of the graft beyond the diagonal anastomosis is totally occluded and this appears relatively fresh.  The saphenous vein graft to the distal right coronary is severely, segmentally and diffusely diseased from the ostium to the proximal segment with 99% focal stenosis in the distal portion of the segmental stenosis. The right coronary is diffusely diseased but patent including a left ventricular branch fills retrogradely from the PDA.  LIMA. the LAD: Totally occluded  LEFT VENTRICULOGRAM: Left ventricular angiogram was done in the 30 RAO projection and revealed reveals inferobasal moderate hypokinesis. Ejection fraction is 50%.  IMPRESSIONS: 1. Bypass graft failure with occlusion of the saphenous vein graft to the obtuse marginal but patent proximal anastomosis  to the diagonal. The saphenous vein graft to the right coronary contains a proximal diffuse high-grade obstruction.  2. Totally occluded LIMA to LAD  3. Totally occluded mid LAD, proximal RCA, and second obtuse marginal. There is severe diffuse disease in the distal native circumflex.  4. Inferobasal moderate hypokinesis with estimated ejection fraction of 50% and elevated end-diastolic pressure compatible with diastolic heart failure  RECOMMENDATION: Complex situation with relatively poor epicardial targets. The patient has bypass  graft failure. He should be considered for repeat surgical revascularization. A bail out approaches surgery is now possible would be stenting of the right coronary saphenous vein graft.. I was unable to investigate the RIMA because of the approach from the left radial.  Assessment/Plan: 68 yo man with severe native and graft 3 vessel CAD presents with an unstable coronary syndrome. He is morbidly obese and diabetic. He does not have favorable anatomy for a percutaneous approach.  His target vessels are by no means ideal, but in my opinion he is a candidate for redo CABG, albeit at moderately high risk. It is not so clear cut that every surgeon would agree with my opinion, so we will have to await Dr. Vivi Martens assessment before knowing for certain.  He will need carotid duplex, given his history of ECCOD, and PFTs. I also will order vein mapping.   I discussed the general nature of the procedure with him and his wife. They are familiar with the procedure having had it before.  We did discuss that redo surgery is more complex and has higher rates of morbidity and mortality than first time CABG.   They understand the risks include but are not limited to death, stroke, MI, DVT/PE, bleeding, possible need for transfusion, infections, incomplete revascularization, and other organ system dysfunction including respiratory, renal, or GI complications.   Discussed with Dr. Dian Situ 10/26/2013, 4:49 PM

## 2013-10-26 NOTE — Progress Notes (Signed)
[facility] for Heparin Indication: chest pain/ACS  Allergies  Allergen Reactions  . Simvastatin Other (See Comments)    Muscle aches and pains   . Ace Inhibitors Cough  . Atorvastatin Other (See Comments)    Muscle aches and pains     Patient Measurements: Height: 5\' 9"  (175.3 cm) Weight: 308 lb 12.8 oz (140.071 kg) IBW/kg (Calculated) : 70.7 Heparin Dosing Weight: 104kg  Vital Signs: Temp: 98.3 F (36.8 C) (04/08 2144) Temp src: Oral (04/08 2144) BP: 131/90 mmHg (04/08 2144) Pulse Rate: 72 (04/08 2144)  Labs:  Recent Labs  10/25/13 1350 10/25/13 1419 10/25/13 2154 10/26/13 0209  HGB  --  15.1  --  15.1  HCT  --  44.5  --  45.0  PLT  --  163  --  149*  APTT 29  --   --   --   LABPROT 13.1  --   --   --   INR 1.01  --   --   --   HEPARINUNFRC  --   --   --  0.11*  CREATININE 0.95  --   --   --   TROPONINI <0.30  --  0.36*  --     Estimated Creatinine Clearance: 106.6 ml/min (by C-G formula based on Cr of 0.95).   Assessment: 68 y.o. male with chest pain for heparin   Goal of Therapy:  Heparin level 0.3-0.7 units/ml Monitor platelets by anticoagulation protocol: Yes   Plan:  Heparin 2000 units IV bolus, then increase heparin 1750 units/hr F/U after cath  Bronson Curb Chaniah Cisse 10/26/2013,3:20 AM

## 2013-10-27 ENCOUNTER — Inpatient Hospital Stay (HOSPITAL_COMMUNITY): Payer: Medicare PPO

## 2013-10-27 DIAGNOSIS — Z01810 Encounter for preprocedural cardiovascular examination: Secondary | ICD-10-CM

## 2013-10-27 LAB — CBC
HCT: 46.5 % (ref 39.0–52.0)
Hemoglobin: 15.6 g/dL (ref 13.0–17.0)
MCH: 29.1 pg (ref 26.0–34.0)
MCHC: 33.5 g/dL (ref 30.0–36.0)
MCV: 86.6 fL (ref 78.0–100.0)
PLATELETS: 189 10*3/uL (ref 150–400)
RBC: 5.37 MIL/uL (ref 4.22–5.81)
RDW: 14 % (ref 11.5–15.5)
WBC: 11.6 10*3/uL — AB (ref 4.0–10.5)

## 2013-10-27 LAB — PULMONARY FUNCTION TEST
DL/VA % pred: 89 %
DL/VA: 4.07 ml/min/mmHg/L
DLCO cor % pred: 81 %
DLCO cor: 25.28 ml/min/mmHg
DLCO unc % pred: 82 %
DLCO unc: 25.63 ml/min/mmHg
FEF 25-75 Post: 2.77 L/sec
FEF 25-75 Pre: 2.14 L/sec
FEF2575-%Change-Post: 29 %
FEF2575-%Pred-Post: 109 %
FEF2575-%Pred-Pre: 84 %
FEV1-%CHANGE-POST: 4 %
FEV1-%PRED-PRE: 91 %
FEV1-%Pred-Post: 95 %
FEV1-POST: 3.11 L
FEV1-Pre: 2.97 L
FEV1FVC-%CHANGE-POST: 5 %
FEV1FVC-%Pred-Pre: 100 %
FEV6-%Change-Post: 0 %
FEV6-%PRED-POST: 95 %
FEV6-%Pred-Pre: 95 %
FEV6-PRE: 3.94 L
FEV6-Post: 3.95 L
FEV6FVC-%Change-Post: 1 %
FEV6FVC-%PRED-PRE: 103 %
FEV6FVC-%Pred-Post: 105 %
FVC-%Change-Post: 0 %
FVC-%Pred-Post: 90 %
FVC-%Pred-Pre: 91 %
FVC-POST: 3.95 L
FVC-Pre: 3.99 L
POST FEV1/FVC RATIO: 79 %
POST FEV6/FVC RATIO: 100 %
Pre FEV1/FVC ratio: 74 %
Pre FEV6/FVC Ratio: 99 %
RV % PRED: 76 %
RV: 1.78 L
TLC % PRED: 83 %
TLC: 5.7 L

## 2013-10-27 LAB — GLUCOSE, CAPILLARY
GLUCOSE-CAPILLARY: 114 mg/dL — AB (ref 70–99)
GLUCOSE-CAPILLARY: 106 mg/dL — AB (ref 70–99)
GLUCOSE-CAPILLARY: 79 mg/dL (ref 70–99)
GLUCOSE-CAPILLARY: 105 mg/dL — AB (ref 70–99)

## 2013-10-27 LAB — HEPARIN LEVEL (UNFRACTIONATED): Heparin Unfractionated: 0.59 IU/mL (ref 0.30–0.70)

## 2013-10-27 MED ORDER — ALBUTEROL SULFATE (2.5 MG/3ML) 0.083% IN NEBU
2.5000 mg | INHALATION_SOLUTION | Freq: Once | RESPIRATORY_TRACT | Status: AC
  Administered 2013-10-27: 2.5 mg via RESPIRATORY_TRACT

## 2013-10-27 NOTE — Progress Notes (Signed)
ANTICOAGULATION CONSULT NOTE - Follow Up Consult  Pharmacy Consult for heparin Indication: CAD  Allergies  Allergen Reactions  . Simvastatin Other (See Comments)    Muscle aches and pains   . Ace Inhibitors Cough  . Atorvastatin Other (See Comments)    Muscle aches and pains     Patient Measurements: Height: 5\' 9"  (175.3 cm) Weight: 307 lb (139.254 kg) IBW/kg (Calculated) : 70.7 Heparin Dosing Weight: 104 kg  Vital Signs: Temp: 98.6 F (37 C) (04/10 0448) Temp src: Oral (04/10 0448) BP: 129/63 mmHg (04/10 0448) Pulse Rate: 84 (04/10 0448)  Labs:  Recent Labs  10/25/13 1350 10/25/13 1419 10/25/13 2154 10/26/13 0209 10/26/13 2305  HGB  --  15.1  --  15.1  --   HCT  --  44.5  --  45.0  --   PLT  --  163  --  149*  --   APTT 29  --   --   --   --   LABPROT 13.1  --   --   --   --   INR 1.01  --   --   --   --   HEPARINUNFRC  --   --   --  0.11* 0.36  CREATININE 0.95  --   --   --   --   TROPONINI <0.30  --  0.36* <0.30  --     Estimated Creatinine Clearance: 106.1 ml/min (by C-G formula based on Cr of 0.95).   Assessment: Patient is a 68 y.o M s/p cath on 4/9 with noted severe 3 vessel disease.  CVTS is working patient up for potential redo CABG.  No bleeding documented. Heparin level is at goal with 0.59.   Goal of Therapy:  Heparin level 0.3-0.7 units/ml Monitor platelets by anticoagulation protocol: Yes   Plan:  1) continue heparin drip at 1750 units/hr  Kimberl Vig P Tyisha Cressy 10/27/2013,8:52 AM

## 2013-10-27 NOTE — Progress Notes (Signed)
Utilization review completed. Hayzen Lorenson, RN, BSN. 

## 2013-10-27 NOTE — Progress Notes (Signed)
    Subjective:  No chest pain or dyspnea.  Objective:  Vital Signs in the last 24 hours: Temp:  [98.6 F (37 C)-98.7 F (37.1 C)] 98.6 F (37 C) (04/10 1354) Pulse Rate:  [76-97] 97 (04/10 1354) Resp:  [19-20] 19 (04/10 1354) BP: (116-129)/(58-65) 128/65 mmHg (04/10 1354) SpO2:  [94 %-97 %] 94 % (04/10 1354) Weight:  [307 lb (139.254 kg)] 307 lb (139.254 kg) (04/10 0448)  Intake/Output from previous day:    Physical Exam: Pt is alert and oriented, pleasant obese male in NAD HEENT: normal Neck: JVP - normal Lungs: CTA bilaterally CV: RRR without murmur or gallop Abd: soft, NT, obese Ext: no C/C/E Skin: warm/dry no rash   Lab Results:  Recent Labs  10/26/13 0209 10/27/13 1235  WBC 7.5 11.6*  HGB 15.1 15.6  PLT 149* 189    Recent Labs  10/25/13 1350  NA 139  K 4.0  CL 101  CO2 23  GLUCOSE 129*  BUN 19  CREATININE 0.95    Recent Labs  10/25/13 2154 10/26/13 0209  TROPONINI 0.36* <0.30   Assessment/Plan:  1. Non-ST elevation MI 2. CAD status post CABG with extensive native vessel and graft disease on cardiac catheterization this admission 3. Type 2 diabetes 4. Hypertension.  The patient is stable on his current medical program which includes a beta blocker and IV heparin. Await evaluation by Dr. Cyndia Bent Monday for consideration of redo CABG.   Sherren Mocha, M.D. 10/27/2013, 5:51 PM

## 2013-10-27 NOTE — Progress Notes (Signed)
  Please see final report under results review for full report.    Margarette Canada, RVT 10/27/2013, 4:45 PM

## 2013-10-27 NOTE — Progress Notes (Signed)
ANTICOAGULATION CONSULT NOTE - Follow Up Consult  Pharmacy Consult for heparin Indication: CP ACS   Allergies  Allergen Reactions  . Simvastatin Other (See Comments)    Muscle aches and pains   . Ace Inhibitors Cough  . Atorvastatin Other (See Comments)    Muscle aches and pains     Patient Measurements: Height: 5\' 9"  (175.3 cm) Weight: 308 lb 2.8 oz (139.788 kg) IBW/kg (Calculated) : 70.7 Heparin Dosing Weight:   Vital Signs: Temp: 98.7 F (37.1 C) (04/09 2057) Temp src: Oral (04/09 2057) BP: 116/58 mmHg (04/09 2057) Pulse Rate: 76 (04/09 2057)  Labs:  Recent Labs  10/25/13 1350 10/25/13 1419 10/25/13 2154 10/26/13 0209 10/26/13 2305  HGB  --  15.1  --  15.1  --   HCT  --  44.5  --  45.0  --   PLT  --  163  --  149*  --   APTT 29  --   --   --   --   LABPROT 13.1  --   --   --   --   INR 1.01  --   --   --   --   HEPARINUNFRC  --   --   --  0.11* 0.36  CREATININE 0.95  --   --   --   --   TROPONINI <0.30  --  0.36* <0.30  --     Estimated Creatinine Clearance: 106.3 ml/min (by C-G formula based on Cr of 0.95).   Medications:    Assessment: Heparin level is therapeutic at 0.36 iu/ml with no bleeding complications noted.  Goal of Therapy:     Plan:  Continue at 1750 units/hr and f/u am labs.   Curlene Dolphin 10/27/2013,12:37 AM

## 2013-10-27 NOTE — Progress Notes (Signed)
1 Day Post-Op Procedure(s) (LRB): LEFT HEART CATHETERIZATION WITH CORONARY ANGIOGRAM (N/A) Subjective: No complaints, denies CP and SOB  Objective: Vital signs in last 24 hours: Temp:  [98.6 F (37 C)-98.7 F (37.1 C)] 98.6 F (37 C) (04/10 1354) Pulse Rate:  [76-97] 97 (04/10 1354) Cardiac Rhythm:  [-] Normal sinus rhythm (04/09 1940) Resp:  [19-20] 19 (04/10 1354) BP: (116-129)/(58-65) 128/65 mmHg (04/10 1354) SpO2:  [94 %-97 %] 94 % (04/10 1354) Weight:  [307 lb (139.254 kg)] 307 lb (139.254 kg) (04/10 0448)  Hemodynamic parameters for last 24 hours:    Intake/Output from previous day:   Intake/Output this shift:    General appearance: alert and no distress  Lab Results:  Recent Labs  10/26/13 0209 10/27/13 1235  WBC 7.5 11.6*  HGB 15.1 15.6  HCT 45.0 46.5  PLT 149* 189   BMET:  Recent Labs  10/25/13 1350  NA 139  K 4.0  CL 101  CO2 23  GLUCOSE 129*  BUN 19  CREATININE 0.95  CALCIUM 9.6    PT/INR:  Recent Labs  10/25/13 1350  LABPROT 13.1  INR 1.01   ABG No results found for this basename: phart, pco2, po2, hco3, tco2, acidbasedef, o2sat   CBG (last 3)   Recent Labs  10/26/13 1145 10/26/13 2059 10/27/13 0730  GLUCAP 142* 103* 114*    Assessment/Plan: S/P Procedure(s) (LRB): LEFT HEART CATHETERIZATION WITH CORONARY ANGIOGRAM (N/A) Severe native and graft 3 vessel CAD with unstable coronary syndrome  Carotids- 60-79% R, no significant stenosis on left Vein mapping and PFTs Ok Dr. Cyndia Bent to evaluate   LOS: 2 days    Melrose Nakayama 10/27/2013

## 2013-10-28 DIAGNOSIS — I480 Paroxysmal atrial fibrillation: Secondary | ICD-10-CM

## 2013-10-28 DIAGNOSIS — E785 Hyperlipidemia, unspecified: Secondary | ICD-10-CM

## 2013-10-28 DIAGNOSIS — I779 Disorder of arteries and arterioles, unspecified: Secondary | ICD-10-CM

## 2013-10-28 DIAGNOSIS — I4891 Unspecified atrial fibrillation: Secondary | ICD-10-CM

## 2013-10-28 DIAGNOSIS — E119 Type 2 diabetes mellitus without complications: Secondary | ICD-10-CM

## 2013-10-28 LAB — CBC
HCT: 44 % (ref 39.0–52.0)
Hemoglobin: 14.8 g/dL (ref 13.0–17.0)
MCH: 29 pg (ref 26.0–34.0)
MCHC: 33.6 g/dL (ref 30.0–36.0)
MCV: 86.3 fL (ref 78.0–100.0)
PLATELETS: 152 10*3/uL (ref 150–400)
RBC: 5.1 MIL/uL (ref 4.22–5.81)
RDW: 14 % (ref 11.5–15.5)
WBC: 13.1 10*3/uL — ABNORMAL HIGH (ref 4.0–10.5)

## 2013-10-28 LAB — BASIC METABOLIC PANEL
BUN: 19 mg/dL (ref 6–23)
CO2: 24 mEq/L (ref 19–32)
Calcium: 9.4 mg/dL (ref 8.4–10.5)
Chloride: 97 mEq/L (ref 96–112)
Creatinine, Ser: 1.09 mg/dL (ref 0.50–1.35)
GFR, EST AFRICAN AMERICAN: 80 mL/min — AB (ref >90)
GFR, EST NON AFRICAN AMERICAN: 69 mL/min — AB (ref >90)
Glucose, Bld: 149 mg/dL — ABNORMAL HIGH (ref 70–99)
Potassium: 4.5 mEq/L (ref 3.7–5.3)
SODIUM: 136 meq/L — AB (ref 137–147)

## 2013-10-28 LAB — GLUCOSE, CAPILLARY
Glucose-Capillary: 108 mg/dL — ABNORMAL HIGH (ref 70–99)
Glucose-Capillary: 102 mg/dL — ABNORMAL HIGH (ref 70–99)
Glucose-Capillary: 112 mg/dL — ABNORMAL HIGH (ref 70–99)
Glucose-Capillary: 122 mg/dL — ABNORMAL HIGH (ref 70–99)

## 2013-10-28 LAB — HEPARIN LEVEL (UNFRACTIONATED)
HEPARIN UNFRACTIONATED: 0.32 [IU]/mL (ref 0.30–0.70)
Heparin Unfractionated: 0.25 IU/mL — ABNORMAL LOW (ref 0.30–0.70)
Heparin Unfractionated: <0.1 IU/mL — ABNORMAL LOW (ref 0.30–0.70)

## 2013-10-28 MED ORDER — FLUTICASONE PROPIONATE 50 MCG/ACT NA SUSP
2.0000 | NASAL | Status: DC | PRN
  Filled 2013-10-28: qty 16

## 2013-10-28 MED ORDER — DILTIAZEM HCL ER COATED BEADS 120 MG PO CP24
120.0000 mg | ORAL_CAPSULE | Freq: Every day | ORAL | Status: DC
  Administered 2013-10-28 – 2013-11-01 (×5): 120 mg via ORAL
  Filled 2013-10-28 (×6): qty 1

## 2013-10-28 MED ORDER — HEPARIN (PORCINE) IN NACL 100-0.45 UNIT/ML-% IJ SOLN
2200.0000 [IU]/h | INTRAMUSCULAR | Status: DC
  Administered 2013-10-29 – 2013-10-30 (×3): 2050 [IU]/h via INTRAVENOUS
  Administered 2013-10-31: 2200 [IU]/h via INTRAVENOUS
  Filled 2013-10-28 (×4): qty 250

## 2013-10-28 NOTE — Progress Notes (Signed)
   Subjective:  No chest pain or dyspnea.  Marland Kitchen amLODipine  5 mg Oral q morning - 10a  . ezetimibe  10 mg Oral Daily  . insulin aspart  0-9 Units Subcutaneous TID WC  . irbesartan  300 mg Oral Daily  . metoprolol succinate  100 mg Oral Daily  . triamterene-hydrochlorothiazide  0.5 tablet Oral q morning - 10a   Objective:  Vital Signs in the last 24 hours: Temp:  [98 F (36.7 C)-100.4 F (38 C)] 98 F (36.7 C) (04/11 0602) Pulse Rate:  [97-100] 100 (04/11 0602) Resp:  [19-20] 20 (04/11 0602) BP: (114-128)/(62-74) 117/66 mmHg (04/11 0956) SpO2:  [93 %-96 %] 96 % (04/11 0602)  Intake/Output from previous day:   Physical Exam: Pt is alert and oriented, pleasant obese male in NAD HEENT: normal Neck: JVP - normal Lungs: CTA bilaterally CV: RRR without murmur or gallop Abd: soft, NT, obese Ext: no C/C/E Skin: warm/dry no rash  Lab Results:  Recent Labs  10/27/13 1235 10/28/13 0540  WBC 11.6* 13.1*  HGB 15.6 14.8  PLT 189 152    Recent Labs  10/25/13 1350  NA 139  K 4.0  CL 101  CO2 23  GLUCOSE 129*  BUN 19  CREATININE 0.95    Recent Labs  10/25/13 2154 10/26/13 0209  TROPONINI 0.36* <0.30   Tele: episodes of a-fib with RVR up to 130 BPM   Assessment/Plan:   1. Non-ST elevation MI 2. CAD status post CABG with extensive native vessel and graft disease on cardiac catheterization this admission 3. Type 2 diabetes 4. Hypertension. 5. Paroxysmal a-fib with RVR, on Heparin drip, we will add Diltiazem 120 mg po CD  The patient is stable on his current medical program which includes a beta blocker and IV heparin. Await evaluation by Dr. Cyndia Bent Monday for consideration of redo CABG.   Dorothy Spark, M.D. 10/28/2013, 10:25 AM

## 2013-10-28 NOTE — Progress Notes (Signed)
ANTICOAGULATION CONSULT NOTE - Follow Up Consult   HL = 0.32 (goal 0.3 - 0.7 units/mL) Heparin dosing weight = 104 kg   Assessment: 86 YOM with CAD to continue on IV heparin until further decision made regarding possible re-do CABG.  Heparin level therapeutic; no bleeding reported.   Plan: - Increase heparin gtt to 2050 units/hr - F/U AM labs    Caron Tardif D. Mina Marble, PharmD, BCPS Pager:  [phone] 10/28/2013, 10:16 PM

## 2013-10-28 NOTE — Progress Notes (Addendum)
ANTICOAGULATION CONSULT NOTE - Follow Up Consult  Pharmacy Consult for heparin Indication: CAD  Allergies  Allergen Reactions  . Simvastatin Other (See Comments)    Muscle aches and pains   . Ace Inhibitors Cough  . Atorvastatin Other (See Comments)    Muscle aches and pains     Patient Measurements: Height: 5\' 9"  (175.3 cm) Weight: 307 lb (139.254 kg) IBW/kg (Calculated) : 70.7 Heparin Dosing Weight: 104 kg  Vital Signs: Temp: 98 F (36.7 C) (04/11 0602) Temp src: Oral (04/11 0602) BP: 114/74 mmHg (04/11 0602) Pulse Rate: 100 (04/11 0602)  Labs:  Recent Labs  10/25/13 1350  10/25/13 2154  10/26/13 0209 10/26/13 2305 10/27/13 1235 10/28/13 0540  HGB  --   < >  --   --  15.1  --  15.6 14.8  HCT  --   < >  --   --  45.0  --  46.5 44.0  PLT  --   < >  --   --  149*  --  189 152  APTT 29  --   --   --   --   --   --   --   LABPROT 13.1  --   --   --   --   --   --   --   INR 1.01  --   --   --   --   --   --   --   HEPARINUNFRC  --   --   --   < > 0.11* 0.36 0.59 0.25*  CREATININE 0.95  --   --   --   --   --   --   --   TROPONINI <0.30  --  0.36*  --  <0.30  --   --   --   < > = values in this interval not displayed.  Estimated Creatinine Clearance: 106.1 ml/min (by C-G formula based on Cr of 0.95).  Assessment: Patient is a 68 y.o M s/p cath on heparin for CAD currently being work-up by CVTS for possible CABG.  Heparin level is subtheraputic at 0.25 this morning but RN started that his IV was "beeping" over night.  Low level this morning may be due to IV flow obstruction.  Goal of Therapy:  Heparin level 0.3-0.7 units/ml Monitor platelets by anticoagulation protocol: Yes   Plan:  1) Continue current heparin rate for now and recheck another heparin level at 11AM today to confirm this morning's level  Yesika Rispoli P [place]ps 10/28/2013,7:22 AM  Adden (@1450 ): Repeat heparin level now back undetectable.  Per RN, no issues with IV line.  Will increase drip up to 1950  units/hr and recheck another 6 hour heparin level.

## 2013-10-29 LAB — BASIC METABOLIC PANEL
BUN: 20 mg/dL (ref 6–23)
CALCIUM: 9.3 mg/dL (ref 8.4–10.5)
CO2: 24 mEq/L (ref 19–32)
CREATININE: 1.08 mg/dL (ref 0.50–1.35)
Chloride: 97 mEq/L (ref 96–112)
GFR calc Af Amer: 81 mL/min — ABNORMAL LOW (ref >90)
GFR calc non Af Amer: 70 mL/min — ABNORMAL LOW (ref >90)
GLUCOSE: 108 mg/dL — AB (ref 70–99)
Potassium: 4 mEq/L (ref 3.7–5.3)
SODIUM: 137 meq/L (ref 137–147)

## 2013-10-29 LAB — URINALYSIS, ROUTINE W REFLEX MICROSCOPIC
BILIRUBIN URINE: NEGATIVE
GLUCOSE, UA: NEGATIVE mg/dL
Ketones, ur: 15 mg/dL — AB
Nitrite: POSITIVE — AB
Protein, ur: NEGATIVE mg/dL
Specific Gravity, Urine: 1.025 (ref 1.005–1.030)
Urobilinogen, UA: 1 mg/dL (ref 0.0–1.0)
pH: 5.5 (ref 5.0–8.0)

## 2013-10-29 LAB — URINE MICROSCOPIC-ADD ON

## 2013-10-29 LAB — GLUCOSE, CAPILLARY
GLUCOSE-CAPILLARY: 99 mg/dL (ref 70–99)
GLUCOSE-CAPILLARY: 98 mg/dL (ref 70–99)
Glucose-Capillary: 122 mg/dL — ABNORMAL HIGH (ref 70–99)
Glucose-Capillary: 107 mg/dL — ABNORMAL HIGH (ref 70–99)

## 2013-10-29 LAB — CBC
HCT: 44.1 % (ref 39.0–52.0)
HEMOGLOBIN: 14.7 g/dL (ref 13.0–17.0)
MCH: 28.5 pg (ref 26.0–34.0)
MCHC: 33.3 g/dL (ref 30.0–36.0)
MCV: 85.5 fL (ref 78.0–100.0)
Platelets: 110 10*3/uL — ABNORMAL LOW (ref 150–400)
RBC: 5.16 MIL/uL (ref 4.22–5.81)
RDW: 14 % (ref 11.5–15.5)
WBC: 14.6 10*3/uL — ABNORMAL HIGH (ref 4.0–10.5)

## 2013-10-29 LAB — HEPARIN LEVEL (UNFRACTIONATED): Heparin Unfractionated: 0.51 IU/mL (ref 0.30–0.70)

## 2013-10-29 NOTE — Progress Notes (Signed)
ANTICOAGULATION CONSULT NOTE - Follow Up Consult  Pharmacy Consult for heparin Indication: CAD  Allergies  Allergen Reactions  . Simvastatin Other (See Comments)    Muscle aches and pains   . Ace Inhibitors Cough  . Atorvastatin Other (See Comments)    Muscle aches and pains     Patient Measurements: Height: 5\' 9"  (175.3 cm) Weight: 307 lb (139.254 kg) IBW/kg (Calculated) : 70.7 Heparin Dosing Weight: 104 kg  Vital Signs: Temp: 99.1 F (37.3 C) (04/12 0500) BP: 107/43 mmHg (04/12 0500) Pulse Rate: 89 (04/12 0500)  Labs:  Recent Labs  10/27/13 1235 10/28/13 0540 10/28/13 1344 10/28/13 2031 10/29/13 0500  HGB 15.6 14.8  --   --   --   HCT 46.5 44.0  --   --   --   PLT 189 152  --   --   --   HEPARINUNFRC 0.59 0.25* <0.10* 0.32 0.51  CREATININE  --   --  1.09  --  1.08    Estimated Creatinine Clearance: 93.4 ml/min (by C-G formula based on Cr of 1.08).   Assessment: Patient is a 68 y.o M s/p cath on heparin for CAD with plan for Dr. Cyndia Bent to see her Monday for evaluation of redo CABG.  Heparin level remains therapeutic at 0.51 this morning.  No bleeding documented.  Goal of Therapy:  Heparin level 0.3-0.7 units/ml Monitor platelets by anticoagulation protocol: Yes   Plan:  1) Continue heparin drip at 2050 units/hr   Carlosdaniel Grob P Klee Kolek 10/29/2013,7:17 AM

## 2013-10-29 NOTE — Progress Notes (Signed)
    Subjective:  No chest pain or dyspnea.  Marland Kitchen amLODipine  5 mg Oral q morning - 10a  . diltiazem  120 mg Oral Daily  . ezetimibe  10 mg Oral Daily  . insulin aspart  0-9 Units Subcutaneous TID WC  . irbesartan  300 mg Oral Daily  . metoprolol succinate  100 mg Oral Daily  . triamterene-hydrochlorothiazide  0.5 tablet Oral q morning - 10a   Objective:  Vital Signs in the last 24 hours: Temp:  [99.1 F (37.3 C)-99.9 F (37.7 C)] 99.1 F (37.3 C) (04/12 0500) Pulse Rate:  [89-94] 89 (04/12 0500) Resp:  [18] 18 (04/12 0500) BP: (107-117)/(43-66) 107/43 mmHg (04/12 0500) SpO2:  [94 %-95 %] 95 % (04/12 0500)  Intake/Output from previous day: 04/11 0701 - 04/12 0700 In: 240 [P.O.:240] Out: 650 [Urine:650]  Physical Exam: Pt is alert and oriented, pleasant obese male in NAD HEENT: normal Neck: JVP - normal Lungs: CTA bilaterally CV: RRR without murmur or gallop Abd: soft, NT, obese Ext: no C/C/E Skin: warm/dry no rash  Lab Results:  Recent Labs  10/28/13 0540 10/29/13 0500  WBC 13.1* 14.6*  HGB 14.8 14.7  PLT 152 110*    Recent Labs  10/28/13 1344 10/29/13 0500  NA 136* 137  K 4.5 4.0  CL 97 97  CO2 24 24  GLUCOSE 149* 108*  BUN 19 20  CREATININE 1.09 1.08   No results found for this basename: TROPONINI, CK, MB,  in the last 72 hours Tele: episodes of a-fib with RVR up to 130 BPM   Assessment/Plan:   1. Non-ST elevation MI 2. CAD status post CABG with extensive native vessel and graft disease on cardiac catheterization this admission 3. Type 2 diabetes 4. Hypertension. 5. Paroxysmal a-fib with RVR, on Heparin drip, we will add Diltiazem 120 mg po CD  The patient is stable on his current medical program which includes a beta blocker and IV heparin. Await evaluation by Dr. Cyndia Bent Monday for consideration of redo CABG.   Dorothy Spark, M.D. 10/29/2013, 8:29 AM

## 2013-10-30 LAB — GLUCOSE, CAPILLARY
GLUCOSE-CAPILLARY: 95 mg/dL (ref 70–99)
Glucose-Capillary: 128 mg/dL — ABNORMAL HIGH (ref 70–99)
Glucose-Capillary: 147 mg/dL — ABNORMAL HIGH (ref 70–99)
Glucose-Capillary: 103 mg/dL — ABNORMAL HIGH (ref 70–99)

## 2013-10-30 LAB — HEPARIN LEVEL (UNFRACTIONATED): Heparin Unfractionated: 0.25 IU/mL — ABNORMAL LOW (ref 0.30–0.70)

## 2013-10-30 LAB — CBC
HEMATOCRIT: 43 % (ref 39.0–52.0)
Hemoglobin: 14.3 g/dL (ref 13.0–17.0)
MCH: 28.4 pg (ref 26.0–34.0)
MCHC: 33.3 g/dL (ref 30.0–36.0)
MCV: 85.3 fL (ref 78.0–100.0)
PLATELETS: 104 10*3/uL — AB (ref 150–400)
RBC: 5.04 MIL/uL (ref 4.22–5.81)
RDW: 14 % (ref 11.5–15.5)
WBC: 11.3 10*3/uL — AB (ref 4.0–10.5)

## 2013-10-30 LAB — BASIC METABOLIC PANEL
BUN: 24 mg/dL — AB (ref 6–23)
CHLORIDE: 97 meq/L (ref 96–112)
CO2: 24 meq/L (ref 19–32)
Calcium: 9.6 mg/dL (ref 8.4–10.5)
Creatinine, Ser: 1.06 mg/dL (ref 0.50–1.35)
GFR calc Af Amer: 83 mL/min — ABNORMAL LOW (ref >90)
GFR calc non Af Amer: 71 mL/min — ABNORMAL LOW (ref >90)
Glucose, Bld: 109 mg/dL — ABNORMAL HIGH (ref 70–99)
Potassium: 4.3 mEq/L (ref 3.7–5.3)
Sodium: 137 mEq/L (ref 137–147)

## 2013-10-30 MED ORDER — DIAZEPAM 5 MG PO TABS
10.0000 mg | ORAL_TABLET | ORAL | Status: AC
Start: 2013-10-31 — End: 2013-10-31
  Administered 2013-10-31: 10 mg via ORAL
  Filled 2013-10-30: qty 2

## 2013-10-30 MED ORDER — ASPIRIN 81 MG PO CHEW
81.0000 mg | CHEWABLE_TABLET | ORAL | Status: AC
  Administered 2013-10-31: 81 mg via ORAL
  Filled 2013-10-30: qty 1

## 2013-10-30 MED ORDER — TICAGRELOR 90 MG PO TABS
180.0000 mg | ORAL_TABLET | Freq: Two times a day (BID) | ORAL | Status: DC
Start: 2013-10-30 — End: 2013-10-31
  Administered 2013-10-30 – 2013-10-31 (×2): 180 mg via ORAL
  Filled 2013-10-30 (×4): qty 2

## 2013-10-30 MED ORDER — ACETAMINOPHEN 325 MG PO TABS
650.0000 mg | ORAL_TABLET | ORAL | Status: DC | PRN
  Administered 2013-10-30: 650 mg via ORAL

## 2013-10-30 MED ORDER — SODIUM CHLORIDE 0.9 % IV SOLN
1.0000 mL/kg/h | INTRAVENOUS | Status: DC
  Administered 2013-10-31: 1 mL/kg/h via INTRAVENOUS

## 2013-10-30 NOTE — Progress Notes (Signed)
4 Days Post-Op Procedure(s) (LRB): LEFT HEART CATHETERIZATION WITH CORONARY ANGIOGRAM (N/A) Subjective:  No chest pain or dyspnea  Objective: Vital signs in last 24 hours: Temp:  [98 F (36.7 C)-99.6 F (37.6 C)] 98 F (36.7 C) (04/13 0500) Pulse Rate:  [66-91] 66 (04/13 0500) Cardiac Rhythm:  [-] Normal sinus rhythm (04/13 0800) Resp:  [16-18] 16 (04/13 0500) BP: (98-106)/(46-55) 106/55 mmHg (04/13 0500) SpO2:  [95 %-99 %] 99 % (04/13 0500)  Hemodynamic parameters for last 24 hours:    Intake/Output from previous day: 04/12 0701 - 04/13 0700 In: 480 [P.O.:480] Out: 300 [Urine:300] Intake/Output this shift:    General appearance: alert and cooperative Heart: regular rate and rhythm, S1, S2 normal, no murmur, click, rub or gallop Lungs: clear to auscultation bilaterally Extremities: extremities normal, atraumatic, no cyanosis or edema  Lab Results:  Recent Labs  10/29/13 0500 10/30/13 0550  WBC 14.6* 11.3*  HGB 14.7 14.3  HCT 44.1 43.0  PLT 110* 104*   BMET:  Recent Labs  10/29/13 0500 10/30/13 0550  NA 137 137  K 4.0 4.3  CL 97 97  CO2 24 24  GLUCOSE 108* 109*  BUN 20 24*  CREATININE 1.08 1.06  CALCIUM 9.3 9.6    PT/INR: No results found for this basename: LABPROT, INR,  in the last 72 hours ABG No results found for this basename: phart, pco2, po2, hco3, tco2, acidbasedef, o2sat   CBG (last 3)   Recent Labs  10/29/13 1649 10/29/13 2123 10/30/13 0744  GLUCAP 107* 98 128*    Assessment/Plan:  I have reviewed his current cardiac cath, prior cath from 2007 and my operative note from his CABG in 2007, and current chart. His current cath shows severe native 3-vessel coronary disease with occlusion of the RCA and LAD. The LIMA to the LAD is occluded which is likely due to competitive retrograde flow from the diagonal vein graft. At his original surgery the LAD was occluded proximally to this diagonal with minimal filling of the LAD and Diagonal. They  were both suitable for grafting at surgery and I could not tell if they communicated so both were grafted. There appears to be unobstructed flow to the diagonal and LAD through this vein graft. The continuation portion to the OM2 is occluded and is likely acute and the reason for development of new symptoms. There are faint collaterals from the left supplying this vessel. The vein to the PDA is diffusely irregular but has a focal 99% stenosis. This supplies the PDA and PL which is a large territory. I noted in my operative note that he had severe diffuse disease in the OM2 extending up to the trifurcation into 3 small sub-branches. The PDA was diffusely diseased and relatively small and the PL was too small to graft itself. I doubt that I would improve this situation with redo CABG given the severity of disease, small distal vessels and very large vein in this morbidly obese diabetic gentleman. His LAD and diagonal appear to have good flow through a patent vein graft. I think it would be best to stent the RCA vein graft and treat him medically after that. That will help his inferior wall and possibly the lateral wall too. I reviewed the studies with the patient and his wife and discussed my recommendations. I answered all of their questions. They seem to understand the complexity of this situation and are in agreement to consider PCI depending on cardiology recommendations.   LOS: 5 days  Gaye Pollack 10/30/2013

## 2013-10-30 NOTE — Progress Notes (Signed)
ANTICOAGULATION CONSULT NOTE - Follow Up Consult  Pharmacy Consult for Heparin Indication: CAD  Allergies  Allergen Reactions  . Simvastatin Other (See Comments)    Muscle aches and pains   . Ace Inhibitors Cough  . Atorvastatin Other (See Comments)    Muscle aches and pains     Patient Measurements: Height: 5\' 9"  (175.3 cm) Weight: 307 lb (139.254 kg) IBW/kg (Calculated) : 70.7 Heparin Dosing Weight: 104 kg  Vital Signs: Temp: 98 F (36.7 C) (04/13 0500) BP: 106/55 mmHg (04/13 0500) Pulse Rate: 66 (04/13 0500)  Labs:  Recent Labs  10/28/13 0540 10/28/13 1344 10/28/13 2031 10/29/13 0500 10/30/13 0550  HGB 14.8  --   --  14.7 14.3  HCT 44.0  --   --  44.1 43.0  PLT 152  --   --  110* 104*  HEPARINUNFRC 0.25* <0.10* 0.32 0.51 0.25*  CREATININE  --  1.09  --  1.08 1.06    Estimated Creatinine Clearance: 95.1 ml/min (by C-G formula based on Cr of 1.06).  Assessment:   Heparin level is subtherapeutic this morning (0.25) on 2050 units/hr.  Level was 0.51 on 4/12; no known IV problems.  Platelet count trending down. No bleeding noted.  Off home aspirin.   S/p cardiac cath 4/9; Dr. Cyndia Bent to see to evaluate for possible re-do CABG.     Goal of Therapy:  Heparin level 0.3-0.7 units/ml Monitor platelets by anticoagulation protocol: Yes   Plan:   Increase heparin drip to 2200 units/hr.  Next heparin level and CBC in am.  Watch platelet count.  Will follow up plans.  Arty Baumgartner, [place] Pager: [phone] 10/30/2013,9:44 AM

## 2013-10-30 NOTE — Care Management Note (Addendum)
  Page 2 of 2   11/01/2013     3:05:29 PM   CARE MANAGEMENT NOTE 11/01/2013  Patient:  Ronnie, Myers   Account Number:  [id number]  Date Initiated:  10/30/2013  Documentation initiated by:  GRAVES-BIGELOW,BRENDA  Subjective/Objective Assessment:   Pt admitted for Unstable angina. Post cath 10-26-13 revealed severe native and graft 3 vessel CAD. Cardiothoracic Surgeon consulted today and plan will be for PCI.     Action/Plan:   CM did spaek to family in reference to disposition needs. Pt is from home with wife. No HH needs at this time. Will continue to monitor.   Anticipated DC Date:  11/01/2013   Anticipated DC Plan:  [facility]  CM consult  Medication Assistance      Choice offered to / List presented to:             Status of service:  Completed, signed off Medicare Important Message given?   (If response is "NO", the following Medicare IM given date fields will be blank) Date Medicare IM given:   Date Additional Medicare IM given:    Discharge Disposition:  HOME/SELF CARE  Per UR Regulation:  Reviewed for med. necessity/level of care/duration of stay  If discussed at [place] of Stay Meetings, dates discussed:    Comments:  Patient was d/c prior to getting Benefits Update: [place] BENEFITS(PART d) Patient provided Brilinta 30 day free card prior to d/c. CM called patient at home to provide further instructions to call the Brilinta # so that assistance program can get initiated and or / confirm if patient has any med coverage as member states he had [facility]. Gerrianne Aydelott RN, BSN, [facility] 11/01/2013  3:04 pm  Brilinta 90mg  by mouth 2x/day Please check coverage; co-pay; authorization, deductible issues, pharmcy. Thanks, [employer] RN, BSN, [place], CCM 11/01/2013  10/31/2013 Handoff Report Note: ---10/31/2013 1319 by [facility] YOUNG--- Transfer from 3w/ cathed/ intervention/ Inpt documented PERCUTANEOUS  CORONARY STENT INTERVENTION (PCI-S) (N/A completed 10/31/2013 Medication Review:  MD modified  Brilinta dosage  from 180 mg BID to 90 mg by mouth twice a day. Amelianna Meller RN, BSN, [place], [place] 10/31/2013

## 2013-10-30 NOTE — Progress Notes (Signed)
Subjective:  No recurrent chest pain  Objective:  Vital Signs in the last 24 hours: BP 106/55  Pulse 66  Temp(Src) 98 F (36.7 C) (Oral)  Resp 16  Ht 5\' 9"  (1.753 m)  Wt 139.254 kg (307 lb)  BMI 45.32 kg/m2  SpO2 99%  Physical Exam: Pleasant WM in NAD Lungs:  Clear Cardiac:  Regular rhythm, normal S1 and S2, no S3 Extremities:  No edema present  Intake/Output from previous day: 04/12 0701 - 04/13 0700 In: 480 [P.O.:480] Out: 300 [Urine:300]  Weight Filed Weights   10/25/13 1311 10/26/13 0431 10/27/13 0448  Weight: 140.071 kg (308 lb 12.8 oz) 139.788 kg (308 lb 2.8 oz) 139.254 kg (307 lb)    Lab Results: Basic Metabolic Panel:  Recent Labs  10/29/13 0500 10/30/13 0550  NA 137 137  K 4.0 4.3  CL 97 97  CO2 24 24  GLUCOSE 108* 109*  BUN 20 24*  CREATININE 1.08 1.06   CBC:  Recent Labs  10/29/13 0500 10/30/13 0550  WBC 14.6* 11.3*  HGB 14.7 14.3  HCT 44.1 43.0  MCV 85.5 85.3  PLT 110* 104*   Telemetry: Sinus   Assessment/Plan: 1. UAP 2. Severe CAD with graft disease 3. Diabetes  Rec:  Awaiting recommendations for redo CABG  W. Doristine Church  MD [facility] Cardiology  10/30/2013, 9:22 AM

## 2013-10-30 NOTE — Progress Notes (Signed)
@  0742 patient complained of urination frequency and burning. Noticed his U/A results. Pt stated he told Dr. Wynonia Lawman about the urination and specimen. I personally called Dr. Wynonia Lawman to inquire about the U/A and if he wanted to order antibiotics. Dr. Wynonia Lawman stated he was going to look at the chart. Will continue to monitor.

## 2013-10-30 NOTE — Progress Notes (Signed)
Discussed with Dr. Bartle earlier and with patient at lunch.  Spoke with Dr. Varanasi who will be in lab tomorrow about PCI of vein graft to RCA.  Spoke also about possibility of intervention of native circumflex to open OM 2 if possible.   Patient and wife agreeable with plan. Will start Brilanta tonight.  Urine results noted.  Will obtain urine culture and watch symptoms.    W. Spencer Claryce Friel, Jr. MD FACC  

## 2013-10-31 ENCOUNTER — Encounter (HOSPITAL_COMMUNITY)
Admission: AD | Disposition: A | Discharge status: Home / Self Care | Payer: Self-pay | Source: Ambulatory Visit | Attending: Cardiology

## 2013-10-31 ENCOUNTER — Encounter (HOSPITAL_COMMUNITY): Payer: Self-pay | Admitting: Interventional Cardiology

## 2013-10-31 ENCOUNTER — Other Ambulatory Visit: Payer: Self-pay

## 2013-10-31 DIAGNOSIS — I25810 Atherosclerosis of coronary artery bypass graft(s) without angina pectoris: Secondary | ICD-10-CM

## 2013-10-31 DIAGNOSIS — I219 Acute myocardial infarction, unspecified: Secondary | ICD-10-CM | POA: Diagnosis present

## 2013-10-31 HISTORY — PX: PERCUTANEOUS CORONARY STENT INTERVENTION (PCI-S): SHX5485

## 2013-10-31 LAB — BASIC METABOLIC PANEL
BUN: 25 mg/dL — ABNORMAL HIGH (ref 6–23)
CO2: 22 mEq/L (ref 19–32)
Calcium: 9.7 mg/dL (ref 8.4–10.5)
Chloride: 99 mEq/L (ref 96–112)
Creatinine, Ser: 1.04 mg/dL (ref 0.50–1.35)
GFR, EST AFRICAN AMERICAN: 84 mL/min — AB (ref >90)
GFR, EST NON AFRICAN AMERICAN: 73 mL/min — AB (ref >90)
Glucose, Bld: 128 mg/dL — ABNORMAL HIGH (ref 70–99)
Potassium: 4.1 mEq/L (ref 3.7–5.3)
SODIUM: 137 meq/L (ref 137–147)

## 2013-10-31 LAB — CBC
HCT: 45.5 % (ref 39.0–52.0)
Hemoglobin: 15.3 g/dL (ref 13.0–17.0)
MCH: 28.5 pg (ref 26.0–34.0)
MCHC: 33.6 g/dL (ref 30.0–36.0)
MCV: 84.9 fL (ref 78.0–100.0)
PLATELETS: 122 10*3/uL — AB (ref 150–400)
RBC: 5.36 MIL/uL (ref 4.22–5.81)
RDW: 14 % (ref 11.5–15.5)
WBC: 7.3 10*3/uL (ref 4.0–10.5)

## 2013-10-31 LAB — GLUCOSE, CAPILLARY
GLUCOSE-CAPILLARY: 133 mg/dL — AB (ref 70–99)
GLUCOSE-CAPILLARY: 114 mg/dL — AB (ref 70–99)
Glucose-Capillary: 105 mg/dL — ABNORMAL HIGH (ref 70–99)
Glucose-Capillary: 95 mg/dL (ref 70–99)

## 2013-10-31 LAB — HEPARIN LEVEL (UNFRACTIONATED): HEPARIN UNFRACTIONATED: 0.39 [IU]/mL (ref 0.30–0.70)

## 2013-10-31 LAB — POCT ACTIVATED CLOTTING TIME
Activated Clotting Time: 237 seconds
Activated Clotting Time: 249 seconds

## 2013-10-31 SURGERY — PERCUTANEOUS CORONARY STENT INTERVENTION (PCI-S)
Anesthesia: LOCAL

## 2013-10-31 MED ORDER — HEPARIN (PORCINE) IN NACL 2-0.9 UNIT/ML-% IJ SOLN
INTRAMUSCULAR | Status: AC
  Filled 2013-10-31: qty 500

## 2013-10-31 MED ORDER — CIPROFLOXACIN HCL 500 MG PO TABS
500.0000 mg | ORAL_TABLET | Freq: Two times a day (BID) | ORAL | Status: DC
Start: 2013-10-31 — End: 2013-11-01
  Administered 2013-10-31 – 2013-11-01 (×2): 500 mg via ORAL
  Filled 2013-10-31 (×4): qty 1

## 2013-10-31 MED ORDER — MIDAZOLAM HCL 2 MG/2ML IJ SOLN
INTRAMUSCULAR | Status: AC
  Filled 2013-10-31: qty 2

## 2013-10-31 MED ORDER — LIDOCAINE HCL (PF) 1 % IJ SOLN
INTRAMUSCULAR | Status: AC
  Filled 2013-10-31: qty 30

## 2013-10-31 MED ORDER — HEPARIN (PORCINE) IN NACL 2-0.9 UNIT/ML-% IJ SOLN
INTRAMUSCULAR | Status: AC
  Filled 2013-10-31: qty 1000

## 2013-10-31 MED ORDER — HEPARIN SODIUM (PORCINE) 1000 UNIT/ML IJ SOLN
INTRAMUSCULAR | Status: AC
  Filled 2013-10-31: qty 1

## 2013-10-31 MED ORDER — TICAGRELOR 90 MG PO TABS: 90.0000 mg | ORAL_TABLET | Freq: Two times a day (BID) | ORAL | Status: DC

## 2013-10-31 MED ORDER — SODIUM CHLORIDE 0.9 % IV SOLN
1.0000 mL/kg/h | INTRAVENOUS | Status: AC
  Administered 2013-10-31: 15:00:00 1 mL/kg/h via INTRAVENOUS

## 2013-10-31 MED ORDER — TICAGRELOR 90 MG PO TABS
90.0000 mg | ORAL_TABLET | Freq: Two times a day (BID) | ORAL | Status: DC
  Administered 2013-10-31 – 2013-11-01 (×2): 90 mg via ORAL
  Filled 2013-10-31 (×3): qty 1

## 2013-10-31 MED ORDER — NITROGLYCERIN 0.2 MG/ML ON CALL CATH LAB
INTRAVENOUS | Status: AC
  Filled 2013-10-31: qty 1

## 2013-10-31 MED ORDER — ASPIRIN 81 MG PO CHEW
81.0000 mg | CHEWABLE_TABLET | Freq: Every day | ORAL | Status: DC
  Administered 2013-11-01: 81 mg via ORAL
  Filled 2013-10-31: qty 1

## 2013-10-31 MED ORDER — FENTANYL CITRATE 0.05 MG/ML IJ SOLN
INTRAMUSCULAR | Status: AC
  Filled 2013-10-31: qty 2

## 2013-10-31 MED ORDER — VERAPAMIL HCL 2.5 MG/ML IV SOLN
INTRAVENOUS | Status: AC
  Filled 2013-10-31: qty 2

## 2013-10-31 NOTE — Progress Notes (Signed)
Subjective:  Tolerated PCI earlier today without problems.  Good result.  No chest pain.  Feels better. C/o dysuria and frequency.  Urine culture done last night.  Objective:  Vital Signs in the last 24 hours: BP 120/61  Pulse 92  Temp(Src) 98.3 F (36.8 C) (Oral)  Resp 18  Ht 5\' 9"  (1.753 m)  Wt 139.254 kg (307 lb)  BMI 45.32 kg/m2  SpO2 97%  Physical Exam: Pleasant WM in NAD Lungs:  Clear Cardiac:  Regular rhythm, normal S1 and S2, no S3 Extremities:  No edema present  Intake/Output from previous day: 04/13 0701 - 04/14 0700 In: 270 [P.O.:270] Out: 850 [Urine:850]  Weight Filed Weights   10/25/13 1311 10/26/13 0431 10/27/13 0448  Weight: 140.071 kg (308 lb 12.8 oz) 139.788 kg (308 lb 2.8 oz) 139.254 kg (307 lb)    Lab Results: Basic Metabolic Panel:  Recent Labs  10/30/13 0550 10/31/13 0540  NA 137 137  K 4.3 4.1  CL 97 99  CO2 24 22  GLUCOSE 109* 128*  BUN 24* 25*  CREATININE 1.06 1.04   CBC:  Recent Labs  10/30/13 0550 10/31/13 0540  WBC 11.3* 7.3  HGB 14.3 15.3  HCT 43.0 45.5  MCV 85.3 84.9  PLT 104* 122*   Telemetry: Sinus   Assessment/Plan: 1. UAP 2. Severe CAD with graft disease stable now post PCI 3. Diabetes 4. Possible UTI  Rec:  Home in am.  Begin Cipro for UTI.   Kerry Hough  MD [facility] Cardiology  10/31/2013, 5:33 PM

## 2013-10-31 NOTE — H&P (View-Only) (Signed)
Discussed with Dr. Cyndia Bent earlier and with patient at lunch.  Spoke with Dr. Irish Lack who will be in lab tomorrow about PCI of vein graft to RCA.  Spoke also about possibility of intervention of native circumflex to open OM 2 if possible.   Patient and wife agreeable with plan. Will start [place].  Urine results noted.  Will obtain urine culture and watch symptoms.    Kerry Hough MD [facility]

## 2013-10-31 NOTE — Interval H&P Note (Signed)
Cath Lab Visit (complete for each Cath Lab visit)  Clinical Evaluation Leading to the Procedure:   ACS: yes  Non-ACS:    Anginal Classification: CCS IV  Anti-ischemic medical therapy: Minimal Therapy (1 class of medications)  Non-Invasive Test Results: No non-invasive testing performed  Prior CABG: Previous CABG      History and Physical Interval Note:  10/31/2013 10:05 AM  Ronnie Myers  has presented today for surgery, with the diagnosis of CAD  The various methods of treatment have been discussed with the patient and family. After consideration of risks, benefits and other options for treatment, the patient has consented to  Procedure(s): PERCUTANEOUS CORONARY STENT INTERVENTION (PCI-S) (N/A) as a surgical intervention .  The patient's history has been reviewed, patient examined, no change in status, stable for surgery.  I have reviewed the patient's chart and labs.  Questions were answered to the patient's satisfaction.     Jettie Booze

## 2013-10-31 NOTE — Progress Notes (Signed)
TR BAND REMOVAL  LOCATION:  left radial  DEFLATED PER PROTOCOL:  yes  TIME BAND OFF / DRESSING APPLIED:   1600   SITE UPON ARRIVAL:   Level 1  SITE AFTER BAND REMOVAL:  Level 1  REVERSE ALLEN'S TEST:    positive  CIRCULATION SENSATION AND MOVEMENT:  Within Normal Limits  yes  COMMENTS:

## 2013-10-31 NOTE — CV Procedure (Signed)
       PROCEDURE:  PCI of SVG to RCA with distal embolic protection  INDICATIONS:  NSTEMI  The risks, benefits, and details of the procedure were explained to the patient.  The patient verbalized understanding and wanted to proceed.  Informed written consent was obtained.  PROCEDURE TECHNIQUE:  After Xylocaine anesthesia a 64F slender sheath was placed in the right radial artery with a single anterior needle wall stick.  The intervention was performed. Please see below for details. A TR band was applied for hemostasis.   CONTRAST:  Total of 70 cc.  COMPLICATIONS:  None.    HEMODYNAMICS:  Aortic pressure was 96/61     ANGIOGRAPHIC DATA:     The SVG to the right coronary artery a large vessel has tandem stenoses in the proximal portion.  There is a long area of moderate disease between the 2 focal stenoses. IV heparin was given for anticoagulation. The patient had been preloaded with Brilinta.  ACT was used to check that the heparin was therapeutic. An MP 1 guide catheter was used to engage the ostium of the vein graft. A pro-water wire was placed across the area of stenosis. A 6 mm spider embolic protection device was then advanced and the basket deployed in the distal portion of the graft. A 2.5 x 20 balloon was used to predilate the areas of stenosis. A 4.0 x 38 resolute drug-eluting stent was deployed across the entire area disease. The stent was post dilated with a 4.5 x 15 noncompliant balloon with multiple inflations to get to 4.5 mm in diameter. There was an excellent angiographic result. The spider device was retrieved.  There did not appear to be any trauma to the distal vessel.    IMPRESSIONS:  1. Successful PCI of the proximal SVG to RCA graft with a 4.0 x 38 resolute drug-eluting stent. This was post dilated to 4.5 mm in diameter.   RECOMMENDATION:  Continue dual antiplatelet therapy for at least a year along with aggressive secondary prevention. I modified the dose of his  Brilinta from 180 mg BID to the typical 90 mg by mouth twice a day dosing. Followup with Dr. Wynonia Lawman.

## 2013-11-01 LAB — URINE CULTURE

## 2013-11-01 LAB — GLUCOSE, CAPILLARY: GLUCOSE-CAPILLARY: 115 mg/dL — AB (ref 70–99)

## 2013-11-01 LAB — BASIC METABOLIC PANEL
BUN: 23 mg/dL (ref 6–23)
CO2: 22 mEq/L (ref 19–32)
Calcium: 9.5 mg/dL (ref 8.4–10.5)
Chloride: 101 mEq/L (ref 96–112)
Creatinine, Ser: 1.09 mg/dL (ref 0.50–1.35)
GFR calc non Af Amer: 69 mL/min — ABNORMAL LOW (ref >90)
GFR, EST AFRICAN AMERICAN: 80 mL/min — AB (ref >90)
GLUCOSE: 104 mg/dL — AB (ref 70–99)
POTASSIUM: 4.4 meq/L (ref 3.7–5.3)
Sodium: 138 mEq/L (ref 137–147)

## 2013-11-01 LAB — CBC
HEMATOCRIT: 43 % (ref 39.0–52.0)
HEMOGLOBIN: 14.4 g/dL (ref 13.0–17.0)
MCH: 28.9 pg (ref 26.0–34.0)
MCHC: 33.5 g/dL (ref 30.0–36.0)
MCV: 86.2 fL (ref 78.0–100.0)
Platelets: 136 10*3/uL — ABNORMAL LOW (ref 150–400)
RBC: 4.99 MIL/uL (ref 4.22–5.81)
RDW: 14 % (ref 11.5–15.5)
WBC: 8.3 10*3/uL (ref 4.0–10.5)

## 2013-11-01 MED ORDER — TICAGRELOR 90 MG PO TABS: 90.0000 mg | ORAL_TABLET | Freq: Two times a day (BID) | ORAL | Status: DC

## 2013-11-01 MED ORDER — NITROGLYCERIN 0.4 MG SL SUBL: 0.4000 mg | SUBLINGUAL_TABLET | SUBLINGUAL | Status: AC | PRN

## 2013-11-01 MED ORDER — NITROGLYCERIN 0.4 MG SL SUBL: 0.4000 mg | SUBLINGUAL_TABLET | SUBLINGUAL | Status: DC | PRN

## 2013-11-01 MED ORDER — CIPROFLOXACIN HCL 500 MG PO TABS
500.0000 mg | ORAL_TABLET | Freq: Two times a day (BID) | ORAL | Status: DC
Start: 2013-11-01 — End: 2013-11-01

## 2013-11-01 MED ORDER — CIPROFLOXACIN HCL 500 MG PO TABS: 500.0000 mg | ORAL_TABLET | Freq: Two times a day (BID) | ORAL | Status: DC

## 2013-11-01 NOTE — Progress Notes (Signed)
CARDIAC REHAB PHASE I   PRE:  Rate/Rhythm: 100 ST    BP: sitting 154/69    SaO2:   MODE:  Ambulation: 600 ft   POST:  Rate/Rhythm: 114 ST    BP: sitting 170/84     SaO2:   Tolerated well, denied SOB. Feels better. Ed completed with wife present emphasizing diet and ex change. Pt seems somewhat receptive. Agreed to referral for CRPII and will send to [place]. [phone]   [place], ACSM 11/01/2013 10:07 AM

## 2013-11-01 NOTE — Discharge Summary (Signed)
Physician Discharge Summary  Patient ID: Ronnie Myers MRN: [id number] DOB/AGE: October 15, 1945 68 y.o.  Admit date: 10/25/2013 Discharge date: 11/01/2013  Primary Physician:  Dr. Janie Morning  Primary Discharge Diagnosis:  1. Unstable angina  Secondary Discharge Diagnosis: 2. Coronary artery disease with bypass graft disease with severe stenosis in the bypass graft to the right coronary artery and occlusion of the mammary graft, distal limb of the graft to the marginal branch which is thought to be acute or recent 3. Morbid obesity 4. Hypertensive heart disease 5. Carotid artery disease 6. History of peptic ulcer disease 7. 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 Urinary tract infection with Escherichia coli-non-hospital-acquired  Procedures:  Cardiac catheterization, percutaneous intervention of the saphenous vein graft to right coronary artery with a drug-eluting stent  Consultations: Cardiac surgery-Dr. Cyndia Bent and [facility] Course: 68 year old male admitted to the hospital for treatment of unstable angina. The patient has a history of coronary bypass grafting in 2007 for significant angina for 3 vessel disease. He has done well since then and has been seen annually. He also had a previous carotid endarterectomy. I saw him in the office the week prior to admission for routine annual followup and he was doing fine at that time. He went to the beach over the weekend but had significant fatigue and malaise and did not feel well. He saw his primary doctor on Monday for complaints of upper respiratory congestion and allergy. Yesterday while walking he had the onset of midsternal chest discomfort suggestive of angina with radiation to his neck and to both shoulders. The discomfort lasted 5-10 minutes and he then noted spontaneous recovery. The evening prior to admission he had an episode that occurred at rest of similar discomfort and was seen at the office the  day of admission area He has not had further discomfort since last evening. This is the first episode of discomfort he has had since his previous surgery. He denies PND, orthopnea or edema. He does complain of a very mild amount of abdominal pain.   The patient was placed on intravenous heparin and was taken to the cardiac catheterization laboratory the day after admission. He was found to have severe graft disease with occlusion of his mammary artery. The vein grafts to a diagonal and marginal branch was patent with a limb of the diagonal branch but had what appeared to be an acute occlusion going around to the marginal branch. The right coronary graft had a severe stenosis in its midportion. He had evidence of significant distal disease also. The native right coronary artery was occluded, the native LAD was occluded and the native second marginal branch was occluded. Initially recommendations were for consideration of redo bypass grafting. He was initially seen by Dr. Roxan Hockey and when his primary physician Dr. Cyndia Bent came to see him he reviewed the films and felt that he would be at increased risk of complications from redo bypass grafting and that stenting of the right coronary graft may be a better long-term option for him.  He underwent intervention of the saphenous pain graft to right coronary artery with placement of a 4.0 a 30 mm resolute drug-eluting stent post dilated to 4.5 mm. This was done using distal protection. He felt better following the procedure and had no recurrence of chest pain following the procedure. He also complained of dysuria while he was in the hospital and was found to have a urinary tract infection with Escherichia coli. He was started on treatment with  Cipro which will be continued on discharge.   He was seen by cardiac rehabilitation and also was recommended that he follow a carbohydrate restricted diet to lose weight. He will be out of work for another week and a half and  was given appropriate activity restrictions. If he has recurrent chest discomfort in the future he will need to have intensified medical therapy and may be considered to see if he could have intervention of the second marginal branch by the native vessel.  Discharge Exam: Blood pressure 140/69, pulse 95, temperature 98.8 F (37.1 C), temperature source Oral, resp. rate 18, height 5\' 9"  (1.753 m), weight 137.4 kg (302 lb 14.6 oz), SpO2 99.00%. Weight: 137.4 kg (302 lb 14.6 oz) Radial catheterization site is clean and dry with only mild ecchymoses, lungs clear, no S3  Labs: CBC:   Lab Results  Component Value Date   WBC 8.3 11/01/2013   HGB 14.4 11/01/2013   HCT 43.0 11/01/2013   MCV 86.2 11/01/2013   PLT 136* 11/01/2013    CMP:  Recent Labs Lab 10/25/13 1350  11/01/13 0438  NA 139  < > 138  K 4.0  < > 4.4  CL 101  < > 101  CO2 23  < > 22  BUN 19  < > 23  CREATININE 0.95  < > 1.09  CALCIUM 9.6  < > 9.5  PROT 7.1  --   --   BILITOT 0.8  --   --   ALKPHOS 61  --   --   ALT 18  --   --   AST 20  --   --   GLUCOSE 129*  < > 104*  < > = values in this interval not displayed.  Lipid Panel     Component Value Date/Time   CHOL 201* 10/25/2013 1350   TRIG 210* 10/25/2013 1350   HDL 51 10/25/2013 1350   CHOLHDL 3.9 10/25/2013 1350   VLDL 42* 10/25/2013 1350   LDLCALC 108* 10/25/2013 1350   Thyroid: Lab Results  Component Value Date   TSH 1.300 10/25/2013    Hemoglobin A1C: Lab Results  Component Value Date   HGBA1C 5.9* 10/25/2013     Radiology: No acute disease, coarse interstitial markings.  EKG: Sinus rhythm, no ST changes laterally, poor with progression laterally  Discharge Medications:   Medication List    STOP taking these medications       BELVIQ 10 MG Tabs  Generic drug:  Lorcaserin HCl     ibuprofen 200 MG tablet  Commonly known as:  ADVIL,MOTRIN      TAKE these medications       amLODipine 5 MG tablet  Commonly known as:  NORVASC  Take 5 mg by mouth every  morning.     aspirin 81 MG chewable tablet  Chew 81-162 mg by mouth 2 (two) times daily. 1 tablet in the morning and 2 tablets in the evening.     ciprofloxacin 500 MG tablet  Commonly known as:  CIPRO  Take 1 tablet (500 mg total) by mouth 2 (two) times daily.     FISH OIL PO  Take 1 capsule by mouth at bedtime.     fluticasone 50 MCG/ACT nasal spray  Commonly known as:  FLONASE  Place 2 sprays into both nostrils daily.     loratadine 10 MG tablet  Commonly known as:  CLARITIN  Take 10 mg by mouth daily as needed for allergies.  metFORMIN 1000 MG tablet  Commonly known as:  GLUCOPHAGE  Take 500 mg by mouth 2 (two) times daily with a meal.     metoprolol 50 MG tablet  Commonly known as:  LOPRESSOR  Take 50 mg by mouth 2 (two) times daily.     nitroGLYCERIN 0.4 MG SL tablet  Commonly known as:  NITROSTAT  Place 1 tablet (0.4 mg total) under the tongue every 5 (five) minutes x 3 doses as needed for chest pain.     rosuvastatin 20 MG tablet  Commonly known as:  CRESTOR  Take 20 mg by mouth every morning.     Ticagrelor 90 MG Tabs tablet  Commonly known as:  BRILINTA  Take 1 tablet (90 mg total) by mouth 2 (two) times daily.     triamterene-hydrochlorothiazide 37.5-25 MG per tablet  Commonly known as:  MAXZIDE-25  Take 0.5 tablets by mouth every morning.       Followup plans and appointments: Follow up Dr. Wynonia Lawman in 2 weeks. He is to avoid heavy lifting and is to report any recurrent angina to [place].  Time spent with patient to include physician time: 30 minutes   Signed: W. Doristine Church. MD [facility] 11/01/2013, 8:55 AM

## 2014-06-28 ENCOUNTER — Encounter (HOSPITAL_COMMUNITY): Payer: Self-pay | Admitting: Interventional Cardiology

## 2014-08-21 DIAGNOSIS — Z79899 Other long term (current) drug therapy: Secondary | ICD-10-CM | POA: Diagnosis not present

## 2014-08-21 DIAGNOSIS — E785 Hyperlipidemia, unspecified: Secondary | ICD-10-CM | POA: Diagnosis not present

## 2014-08-21 DIAGNOSIS — Z8711 Personal history of peptic ulcer disease: Secondary | ICD-10-CM | POA: Diagnosis not present

## 2014-08-21 DIAGNOSIS — I252 Old myocardial infarction: Secondary | ICD-10-CM | POA: Diagnosis not present

## 2014-08-21 DIAGNOSIS — E668 Other obesity: Secondary | ICD-10-CM | POA: Diagnosis not present

## 2014-08-21 DIAGNOSIS — I119 Hypertensive heart disease without heart failure: Secondary | ICD-10-CM | POA: Diagnosis not present

## 2014-08-21 DIAGNOSIS — Z955 Presence of coronary angioplasty implant and graft: Secondary | ICD-10-CM | POA: Diagnosis not present

## 2014-08-21 DIAGNOSIS — I2510 Atherosclerotic heart disease of native coronary artery without angina pectoris: Secondary | ICD-10-CM | POA: Diagnosis not present

## 2014-10-30 DIAGNOSIS — E119 Type 2 diabetes mellitus without complications: Secondary | ICD-10-CM | POA: Diagnosis not present

## 2014-10-30 DIAGNOSIS — Z961 Presence of intraocular lens: Secondary | ICD-10-CM | POA: Diagnosis not present

## 2014-10-30 DIAGNOSIS — H04123 Dry eye syndrome of bilateral lacrimal glands: Secondary | ICD-10-CM | POA: Diagnosis not present

## 2015-01-24 ENCOUNTER — Ambulatory Visit (INDEPENDENT_AMBULATORY_CARE_PROVIDER_SITE_OTHER): Payer: Commercial Managed Care - HMO | Admitting: Podiatry

## 2015-01-24 ENCOUNTER — Ambulatory Visit (INDEPENDENT_AMBULATORY_CARE_PROVIDER_SITE_OTHER): Payer: Commercial Managed Care - HMO

## 2015-01-24 ENCOUNTER — Encounter: Payer: Self-pay | Admitting: Podiatry

## 2015-01-24 VITALS — BP 138/72 | HR 88 | Resp 16

## 2015-01-24 DIAGNOSIS — M7662 Achilles tendinitis, left leg: Secondary | ICD-10-CM

## 2015-01-24 DIAGNOSIS — M722 Plantar fascial fibromatosis: Secondary | ICD-10-CM | POA: Diagnosis not present

## 2015-01-24 MED ORDER — MELOXICAM 15 MG PO TABS: 15.0000 mg | ORAL_TABLET | Freq: Every day | ORAL | Status: DC

## 2015-01-24 NOTE — Progress Notes (Signed)
   Subjective:    Patient ID: Ronnie Myers, male    DOB: 02-27-1946, 69 y.o.   MRN: [id number]  HPI Comments: "I have a pain in the heel"  Patient c/o aching posterior heel left for few months. AM pain. Tried icing-helps some.  Also, concerned with 1st toe left, numbness. He does have a bunion and also the tip of the 2nd toe is sore-he has a thickened nail on it.  Patient states that he is not diabetic, chart states otherwise.     Review of Systems  Neurological: Positive for numbness.  Hematological: Bruises/bleeds easily.  All other systems reviewed and are negative.      Objective:   Physical Exam: I have reviewed his past medical history medications allergies surgery social history and review of systems. Pulses are strongly palpable bilateral. Neurologic sensorium is intact per Semmes-Weinstein monofilament. Deep tendon reflexes are intact bilateral and muscle strength +5 over 5 dorsiflexion plantar flexors and inverters everters all into the musculature is intact. Orthopedic evaluation demonstrates all joints distal to the ankle full range of motion without crepitation. He has tenderness on palpation of the Achilles tendon as it inserts on the posterior superior calcaneus. There is an area of bogginess and fluctuance beneath the scan consistent with a bursitis. It is not warm to the touch but radiographs do confirm a posterior heel spur as well as thickening of the Achilles tendon at its insertion site. Radiographs also confirm hallux abductovalgus deformity which is confirmed on physical exam. Hallux limitus is also confirmed on physical exam but does not demonstrate any spurring or osteoarthritic changes on radiograph.        Assessment & Plan:  Assessment: Chronic Achilles tendinitis left heel. Hallux valgus with hallux limitus first metatarsophalangeal joint left.  Plan: Discussed etiology pathology conservative versus surgical therapies. At this point I injected the bursa  today with dexamethasone and local and aesthetic. Placed in a Cam Walker encouraged ice therapy and pulse therapy with his meloxicam which we prescribed. He will stay on the meloxicam more than a week and a time secondary to the clopidogrel. I will follow-up with him in 1 month physical therapy may be necessary at that time.

## 2015-02-21 ENCOUNTER — Encounter: Payer: Self-pay | Admitting: Podiatry

## 2015-02-21 ENCOUNTER — Ambulatory Visit (INDEPENDENT_AMBULATORY_CARE_PROVIDER_SITE_OTHER): Payer: Commercial Managed Care - HMO | Admitting: Podiatry

## 2015-02-21 VITALS — BP 136/74 | HR 82 | Resp 12

## 2015-02-21 DIAGNOSIS — M7662 Achilles tendinitis, left leg: Secondary | ICD-10-CM

## 2015-02-21 MED ORDER — METHYLPREDNISOLONE 4 MG PO TBPK: ORAL_TABLET | ORAL | Status: DC

## 2015-02-21 NOTE — Progress Notes (Signed)
He presents today for follow-up of insertional Achilles tendinitis left heel. He states that he's been wearing his cam walker for the past month and it feels approximately 60-70% improved.  Objective: Vital signs are stable he is alert and oriented 3. Pulses are palpable left foot. He has mild tenderness on palpation of the Achilles tendon at its superior calcaneal margin. Small bursal sac is palpated.  Assessment: Achilles tendinitis insertional in nature with bursitis left.  Plan: Discussed etiology pathology conservative versus surgical therapies at this point in lieu of reinjecting his left heel has started him on a Medrol Dosepak. He will continue the use of the Cam Walker today for 1 week and at night for 1 month. Follow-up with him at that time. If he is no better at that time then we will consider MRI.

## 2015-02-26 DIAGNOSIS — Z79899 Other long term (current) drug therapy: Secondary | ICD-10-CM | POA: Diagnosis not present

## 2015-02-26 DIAGNOSIS — Z955 Presence of coronary angioplasty implant and graft: Secondary | ICD-10-CM | POA: Diagnosis not present

## 2015-02-26 DIAGNOSIS — I252 Old myocardial infarction: Secondary | ICD-10-CM | POA: Diagnosis not present

## 2015-02-26 DIAGNOSIS — E785 Hyperlipidemia, unspecified: Secondary | ICD-10-CM | POA: Diagnosis not present

## 2015-02-26 DIAGNOSIS — I119 Hypertensive heart disease without heart failure: Secondary | ICD-10-CM | POA: Diagnosis not present

## 2015-02-26 DIAGNOSIS — Z951 Presence of aortocoronary bypass graft: Secondary | ICD-10-CM | POA: Diagnosis not present

## 2015-02-26 DIAGNOSIS — I2510 Atherosclerotic heart disease of native coronary artery without angina pectoris: Secondary | ICD-10-CM | POA: Diagnosis not present

## 2015-02-26 DIAGNOSIS — E668 Other obesity: Secondary | ICD-10-CM | POA: Diagnosis not present

## 2015-02-26 DIAGNOSIS — Z8711 Personal history of peptic ulcer disease: Secondary | ICD-10-CM | POA: Diagnosis not present

## 2015-03-21 ENCOUNTER — Ambulatory Visit: Payer: Commercial Managed Care - HMO | Admitting: Podiatry

## 2015-04-12 ENCOUNTER — Other Ambulatory Visit: Payer: Self-pay | Admitting: Cardiology

## 2015-04-12 ENCOUNTER — Ambulatory Visit (HOSPITAL_COMMUNITY)
Admission: RE | Admit: 2015-04-12 | Discharge: 2015-04-12 | Disposition: A | Discharge status: Home / Self Care | Payer: Commercial Managed Care - HMO | Source: Ambulatory Visit | Attending: Vascular Surgery | Admitting: Vascular Surgery

## 2015-04-12 DIAGNOSIS — I119 Hypertensive heart disease without heart failure: Secondary | ICD-10-CM | POA: Diagnosis not present

## 2015-04-12 DIAGNOSIS — I6523 Occlusion and stenosis of bilateral carotid arteries: Secondary | ICD-10-CM | POA: Insufficient documentation

## 2015-04-12 DIAGNOSIS — E119 Type 2 diabetes mellitus without complications: Secondary | ICD-10-CM | POA: Insufficient documentation

## 2015-04-12 DIAGNOSIS — E785 Hyperlipidemia, unspecified: Secondary | ICD-10-CM | POA: Diagnosis not present

## 2015-04-18 DIAGNOSIS — E785 Hyperlipidemia, unspecified: Secondary | ICD-10-CM | POA: Diagnosis not present

## 2015-04-18 DIAGNOSIS — Z125 Encounter for screening for malignant neoplasm of prostate: Secondary | ICD-10-CM | POA: Diagnosis not present

## 2015-04-18 DIAGNOSIS — E119 Type 2 diabetes mellitus without complications: Secondary | ICD-10-CM | POA: Diagnosis not present

## 2015-04-18 DIAGNOSIS — I10 Essential (primary) hypertension: Secondary | ICD-10-CM | POA: Diagnosis not present

## 2015-04-25 DIAGNOSIS — R739 Hyperglycemia, unspecified: Secondary | ICD-10-CM | POA: Diagnosis not present

## 2015-04-25 DIAGNOSIS — R635 Abnormal weight gain: Secondary | ICD-10-CM | POA: Diagnosis not present

## 2015-04-25 DIAGNOSIS — I10 Essential (primary) hypertension: Secondary | ICD-10-CM | POA: Diagnosis not present

## 2015-04-25 DIAGNOSIS — Z0000 Encounter for general adult medical examination without abnormal findings: Secondary | ICD-10-CM | POA: Diagnosis not present

## 2015-04-25 DIAGNOSIS — Z23 Encounter for immunization: Secondary | ICD-10-CM | POA: Diagnosis not present

## 2015-04-25 DIAGNOSIS — E785 Hyperlipidemia, unspecified: Secondary | ICD-10-CM | POA: Diagnosis not present

## 2015-04-25 DIAGNOSIS — E669 Obesity, unspecified: Secondary | ICD-10-CM | POA: Diagnosis not present

## 2015-04-25 DIAGNOSIS — I679 Cerebrovascular disease, unspecified: Secondary | ICD-10-CM | POA: Diagnosis not present

## 2015-04-30 DIAGNOSIS — Z1212 Encounter for screening for malignant neoplasm of rectum: Secondary | ICD-10-CM | POA: Diagnosis not present

## 2015-05-07 IMAGING — CR DG CHEST 2V
2 series · 2 of 2 positions shown · non-contrast
Comparison: 10/13/2005

CLINICAL DATA: Unstable angina

EXAM:
CHEST  2 VIEW

[w chest pa]
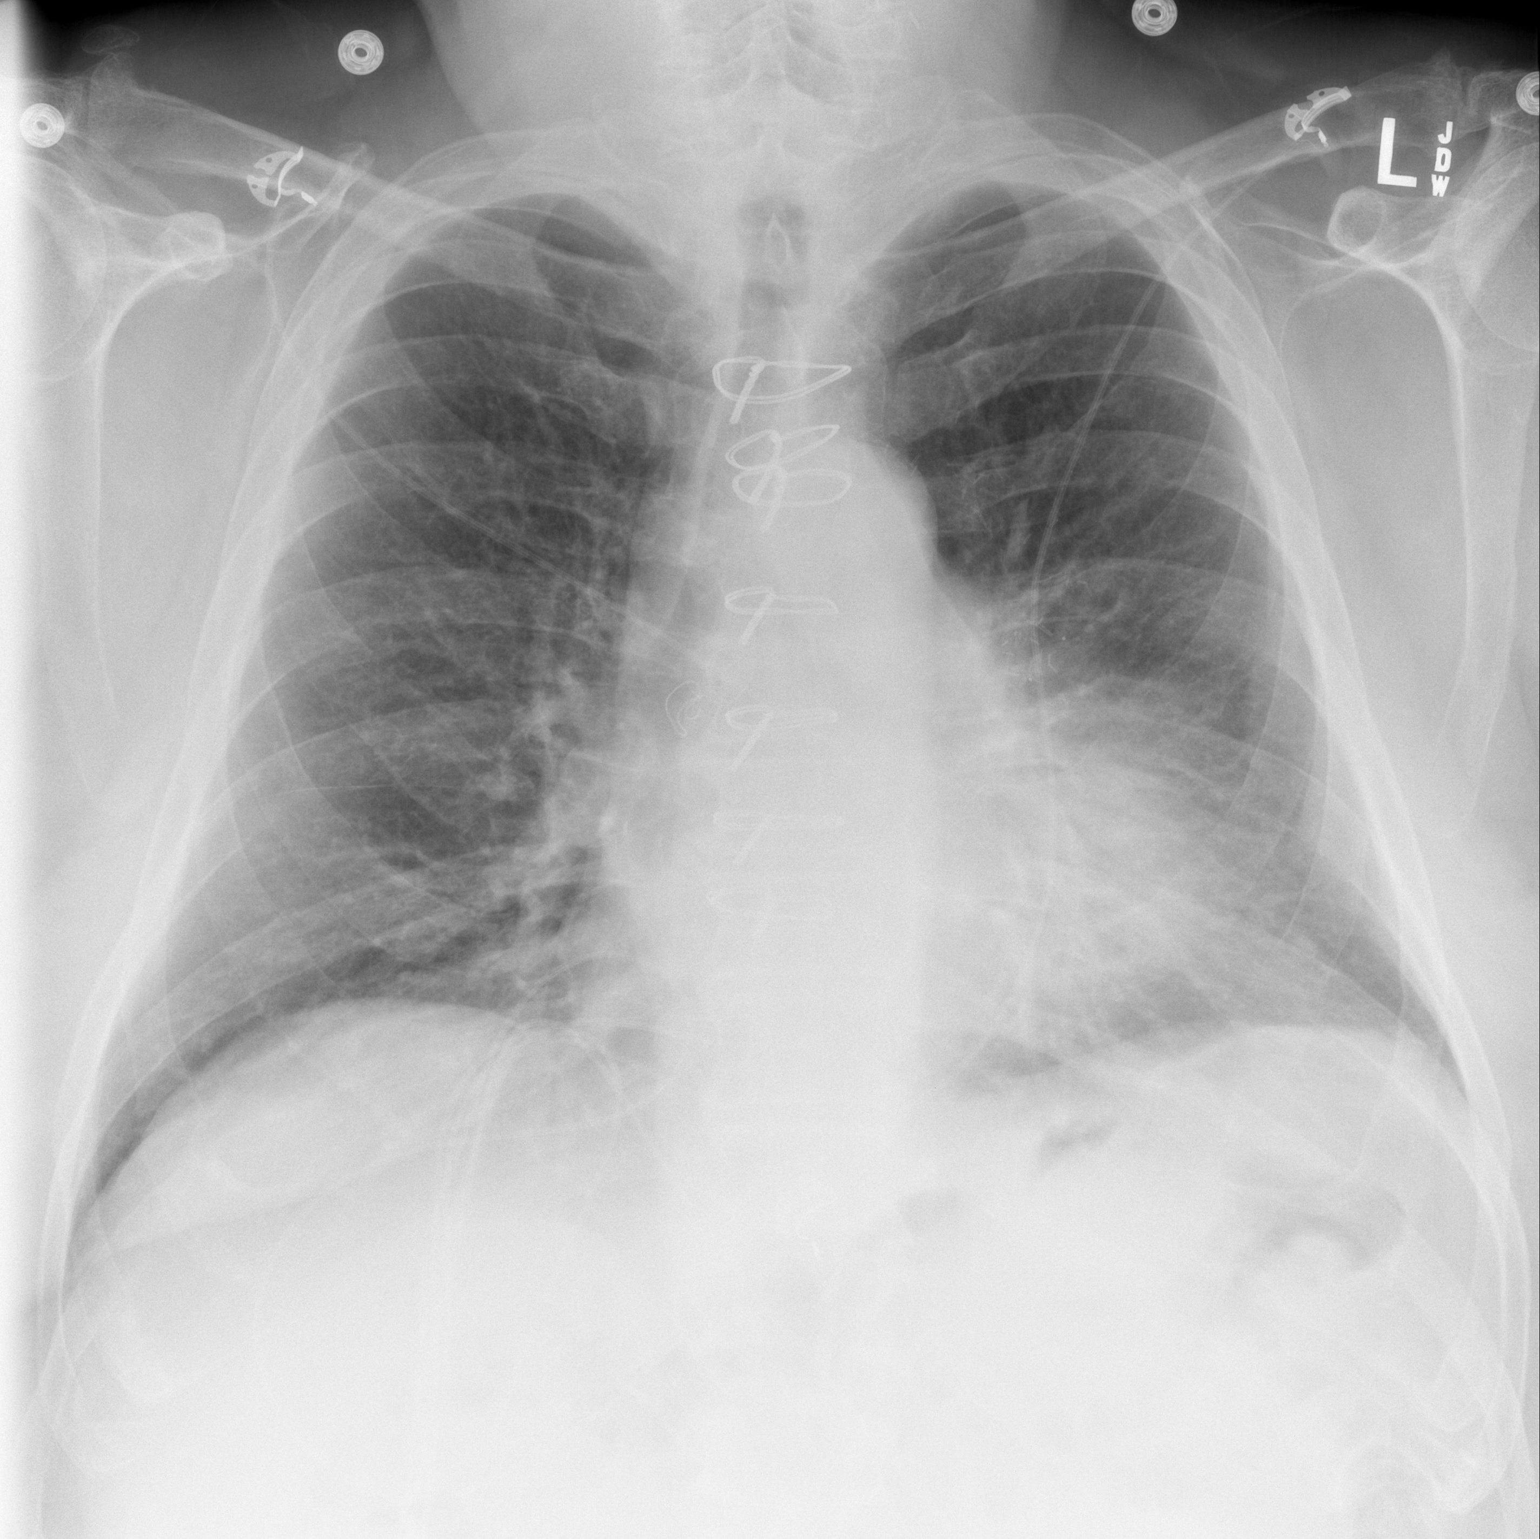

[w chest lat *]
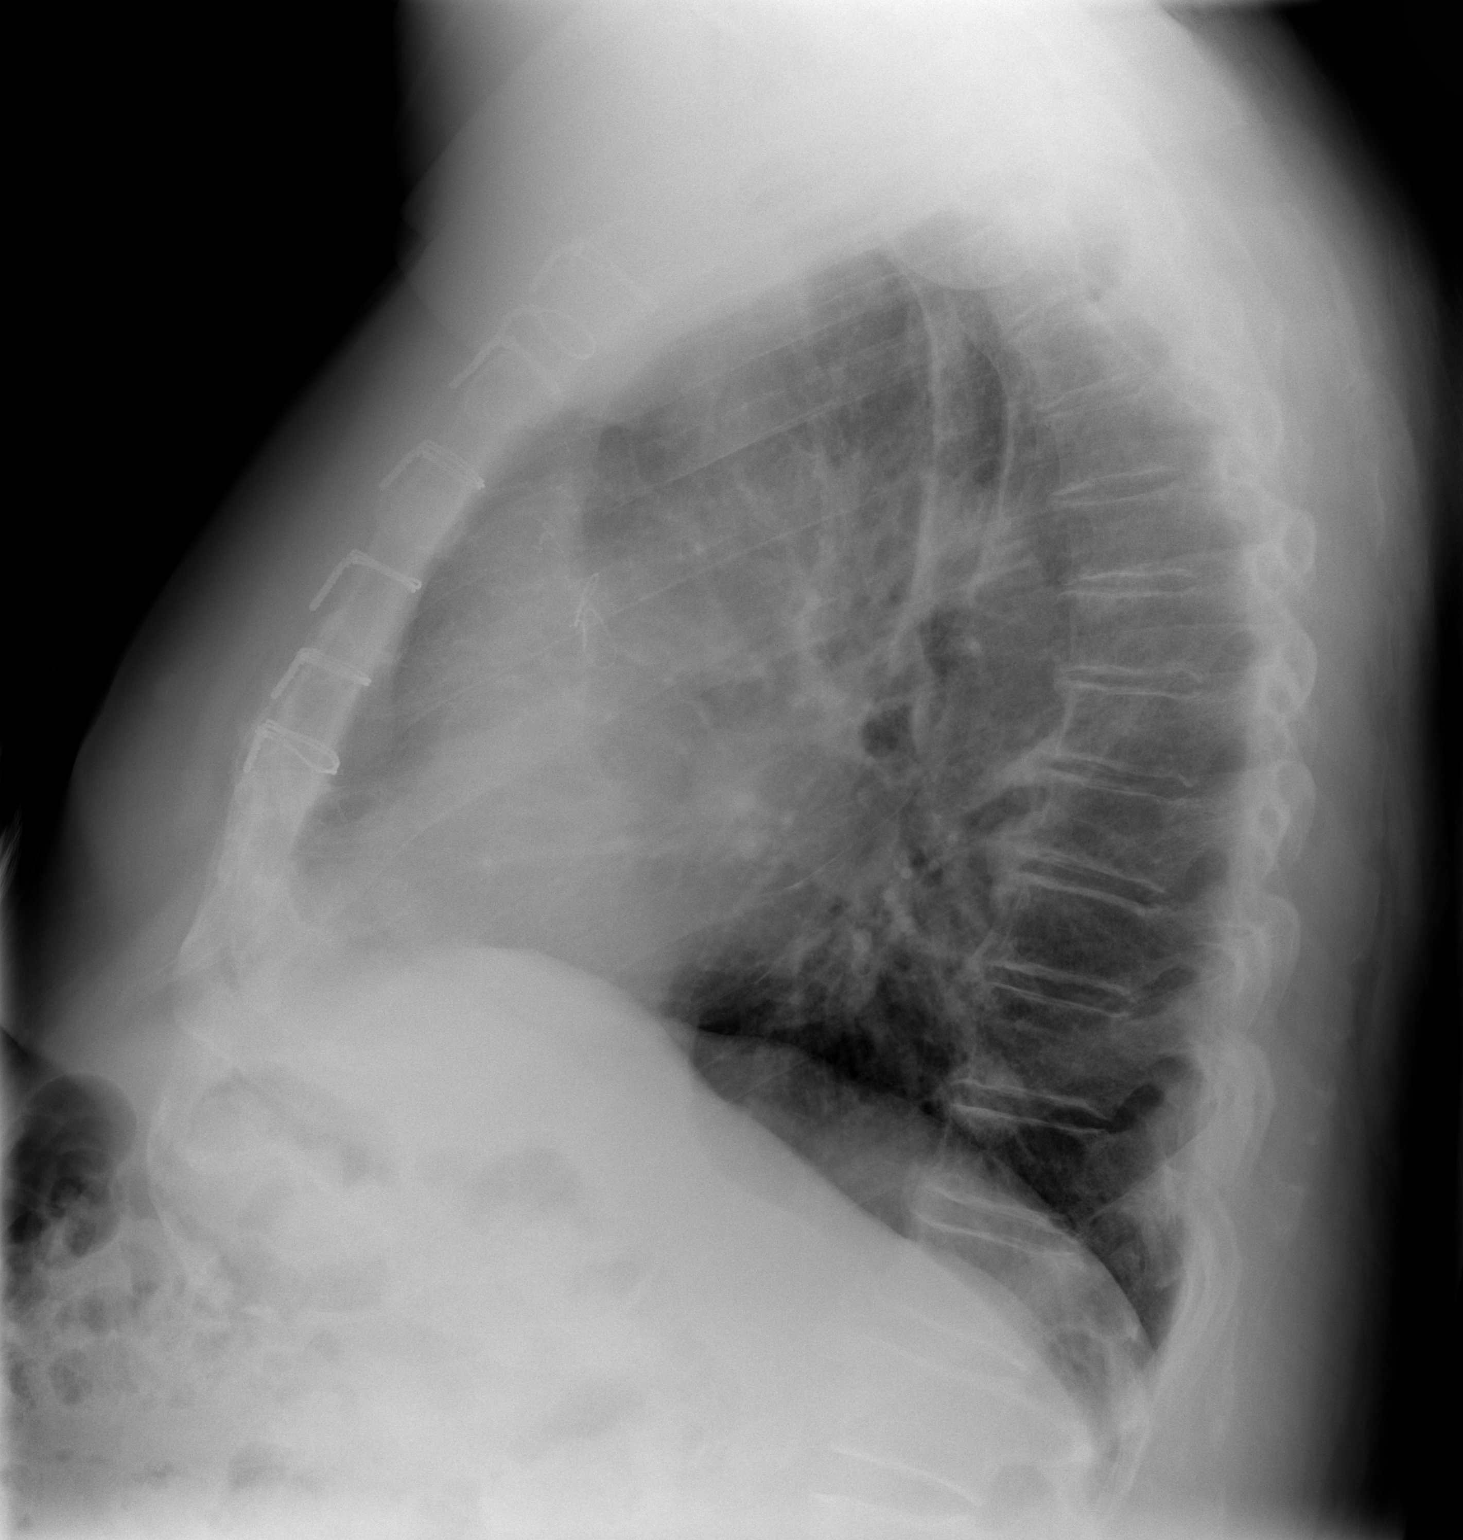

[2 of 2 positions shown; findings below may reference images not displayed]

FINDINGS: Previous median sternotomy for CABG. No cardiomegaly. Stable
mediastinal contours. Chronic interstitial coarsening without overt
edema. No effusion or pneumothorax. No consolidation.
IMPRESSION: Stable exam.  No evidence of acute cardiopulmonary disease.

## 2015-09-30 DIAGNOSIS — Z79899 Other long term (current) drug therapy: Secondary | ICD-10-CM | POA: Diagnosis not present

## 2015-09-30 DIAGNOSIS — I6523 Occlusion and stenosis of bilateral carotid arteries: Secondary | ICD-10-CM | POA: Diagnosis not present

## 2015-09-30 DIAGNOSIS — Z955 Presence of coronary angioplasty implant and graft: Secondary | ICD-10-CM | POA: Diagnosis not present

## 2015-09-30 DIAGNOSIS — I2510 Atherosclerotic heart disease of native coronary artery without angina pectoris: Secondary | ICD-10-CM | POA: Diagnosis not present

## 2015-09-30 DIAGNOSIS — Z8711 Personal history of peptic ulcer disease: Secondary | ICD-10-CM | POA: Diagnosis not present

## 2015-09-30 DIAGNOSIS — I119 Hypertensive heart disease without heart failure: Secondary | ICD-10-CM | POA: Diagnosis not present

## 2015-09-30 DIAGNOSIS — I252 Old myocardial infarction: Secondary | ICD-10-CM | POA: Diagnosis not present

## 2015-09-30 DIAGNOSIS — E668 Other obesity: Secondary | ICD-10-CM | POA: Diagnosis not present

## 2015-09-30 DIAGNOSIS — R0602 Shortness of breath: Secondary | ICD-10-CM | POA: Diagnosis not present

## 2015-09-30 DIAGNOSIS — E785 Hyperlipidemia, unspecified: Secondary | ICD-10-CM | POA: Diagnosis not present

## 2015-11-13 DIAGNOSIS — E119 Type 2 diabetes mellitus without complications: Secondary | ICD-10-CM | POA: Diagnosis not present

## 2015-11-13 DIAGNOSIS — H04123 Dry eye syndrome of bilateral lacrimal glands: Secondary | ICD-10-CM | POA: Diagnosis not present

## 2015-11-13 DIAGNOSIS — H26492 Other secondary cataract, left eye: Secondary | ICD-10-CM | POA: Diagnosis not present

## 2015-11-13 DIAGNOSIS — Z961 Presence of intraocular lens: Secondary | ICD-10-CM | POA: Diagnosis not present

## 2015-11-13 DIAGNOSIS — H10413 Chronic giant papillary conjunctivitis, bilateral: Secondary | ICD-10-CM | POA: Diagnosis not present

## 2016-03-03 DIAGNOSIS — R0602 Shortness of breath: Secondary | ICD-10-CM | POA: Diagnosis not present

## 2016-03-03 DIAGNOSIS — Z955 Presence of coronary angioplasty implant and graft: Secondary | ICD-10-CM | POA: Diagnosis not present

## 2016-03-03 DIAGNOSIS — E785 Hyperlipidemia, unspecified: Secondary | ICD-10-CM | POA: Diagnosis not present

## 2016-03-03 DIAGNOSIS — R42 Dizziness and giddiness: Secondary | ICD-10-CM | POA: Diagnosis not present

## 2016-03-03 DIAGNOSIS — I2510 Atherosclerotic heart disease of native coronary artery without angina pectoris: Secondary | ICD-10-CM | POA: Diagnosis not present

## 2016-03-03 DIAGNOSIS — I6523 Occlusion and stenosis of bilateral carotid arteries: Secondary | ICD-10-CM | POA: Diagnosis not present

## 2016-03-03 DIAGNOSIS — I119 Hypertensive heart disease without heart failure: Secondary | ICD-10-CM | POA: Diagnosis not present

## 2016-03-03 DIAGNOSIS — I252 Old myocardial infarction: Secondary | ICD-10-CM | POA: Diagnosis not present

## 2016-03-03 DIAGNOSIS — E668 Other obesity: Secondary | ICD-10-CM | POA: Diagnosis not present

## 2016-04-29 DIAGNOSIS — I10 Essential (primary) hypertension: Secondary | ICD-10-CM | POA: Diagnosis not present

## 2016-04-29 DIAGNOSIS — E784 Other hyperlipidemia: Secondary | ICD-10-CM | POA: Diagnosis not present

## 2016-04-29 DIAGNOSIS — E119 Type 2 diabetes mellitus without complications: Secondary | ICD-10-CM | POA: Diagnosis not present

## 2016-04-29 DIAGNOSIS — Z125 Encounter for screening for malignant neoplasm of prostate: Secondary | ICD-10-CM | POA: Diagnosis not present

## 2016-05-07 DIAGNOSIS — M545 Low back pain: Secondary | ICD-10-CM | POA: Diagnosis not present

## 2016-05-07 DIAGNOSIS — Z1389 Encounter for screening for other disorder: Secondary | ICD-10-CM | POA: Diagnosis not present

## 2016-05-07 DIAGNOSIS — R7309 Other abnormal glucose: Secondary | ICD-10-CM | POA: Diagnosis not present

## 2016-05-07 DIAGNOSIS — I6789 Other cerebrovascular disease: Secondary | ICD-10-CM | POA: Diagnosis not present

## 2016-05-07 DIAGNOSIS — E7800 Pure hypercholesterolemia, unspecified: Secondary | ICD-10-CM | POA: Diagnosis not present

## 2016-05-07 DIAGNOSIS — I10 Essential (primary) hypertension: Secondary | ICD-10-CM | POA: Diagnosis not present

## 2016-05-07 DIAGNOSIS — Z0000 Encounter for general adult medical examination without abnormal findings: Secondary | ICD-10-CM | POA: Diagnosis not present

## 2016-05-07 DIAGNOSIS — Z23 Encounter for immunization: Secondary | ICD-10-CM | POA: Diagnosis not present

## 2016-05-08 DIAGNOSIS — Z1212 Encounter for screening for malignant neoplasm of rectum: Secondary | ICD-10-CM | POA: Diagnosis not present

## 2016-09-01 DIAGNOSIS — E668 Other obesity: Secondary | ICD-10-CM | POA: Diagnosis not present

## 2016-09-01 DIAGNOSIS — Z8711 Personal history of peptic ulcer disease: Secondary | ICD-10-CM | POA: Diagnosis not present

## 2016-09-01 DIAGNOSIS — Z79899 Other long term (current) drug therapy: Secondary | ICD-10-CM | POA: Diagnosis not present

## 2016-09-01 DIAGNOSIS — I6523 Occlusion and stenosis of bilateral carotid arteries: Secondary | ICD-10-CM | POA: Diagnosis not present

## 2016-09-01 DIAGNOSIS — E785 Hyperlipidemia, unspecified: Secondary | ICD-10-CM | POA: Diagnosis not present

## 2016-09-01 DIAGNOSIS — I2510 Atherosclerotic heart disease of native coronary artery without angina pectoris: Secondary | ICD-10-CM | POA: Diagnosis not present

## 2016-09-01 DIAGNOSIS — Z955 Presence of coronary angioplasty implant and graft: Secondary | ICD-10-CM | POA: Diagnosis not present

## 2016-09-01 DIAGNOSIS — I252 Old myocardial infarction: Secondary | ICD-10-CM | POA: Diagnosis not present

## 2016-09-01 DIAGNOSIS — I119 Hypertensive heart disease without heart failure: Secondary | ICD-10-CM | POA: Diagnosis not present

## 2016-10-13 DIAGNOSIS — Z8711 Personal history of peptic ulcer disease: Secondary | ICD-10-CM | POA: Diagnosis not present

## 2016-10-13 DIAGNOSIS — I2510 Atherosclerotic heart disease of native coronary artery without angina pectoris: Secondary | ICD-10-CM | POA: Diagnosis not present

## 2016-10-13 DIAGNOSIS — I252 Old myocardial infarction: Secondary | ICD-10-CM | POA: Diagnosis not present

## 2016-10-13 DIAGNOSIS — E785 Hyperlipidemia, unspecified: Secondary | ICD-10-CM | POA: Diagnosis not present

## 2016-10-13 DIAGNOSIS — I119 Hypertensive heart disease without heart failure: Secondary | ICD-10-CM | POA: Diagnosis not present

## 2016-10-13 DIAGNOSIS — I6523 Occlusion and stenosis of bilateral carotid arteries: Secondary | ICD-10-CM | POA: Diagnosis not present

## 2016-10-13 DIAGNOSIS — Z955 Presence of coronary angioplasty implant and graft: Secondary | ICD-10-CM | POA: Diagnosis not present

## 2016-10-13 DIAGNOSIS — Z79899 Other long term (current) drug therapy: Secondary | ICD-10-CM | POA: Diagnosis not present

## 2017-03-09 DIAGNOSIS — H02834 Dermatochalasis of left upper eyelid: Secondary | ICD-10-CM | POA: Diagnosis not present

## 2017-03-09 DIAGNOSIS — E119 Type 2 diabetes mellitus without complications: Secondary | ICD-10-CM | POA: Diagnosis not present

## 2017-03-09 DIAGNOSIS — Z961 Presence of intraocular lens: Secondary | ICD-10-CM | POA: Diagnosis not present

## 2017-03-09 DIAGNOSIS — H02831 Dermatochalasis of right upper eyelid: Secondary | ICD-10-CM | POA: Diagnosis not present

## 2017-04-09 DIAGNOSIS — S0501XA Injury of conjunctiva and corneal abrasion without foreign body, right eye, initial encounter: Secondary | ICD-10-CM | POA: Diagnosis not present

## 2017-04-09 DIAGNOSIS — H2000 Unspecified acute and subacute iridocyclitis: Secondary | ICD-10-CM | POA: Diagnosis not present

## 2017-04-09 DIAGNOSIS — S0011XA Contusion of right eyelid and periocular area, initial encounter: Secondary | ICD-10-CM | POA: Diagnosis not present

## 2017-04-16 DIAGNOSIS — S0501XD Injury of conjunctiva and corneal abrasion without foreign body, right eye, subsequent encounter: Secondary | ICD-10-CM | POA: Diagnosis not present

## 2017-05-06 DIAGNOSIS — E7849 Other hyperlipidemia: Secondary | ICD-10-CM | POA: Diagnosis not present

## 2017-05-06 DIAGNOSIS — Z0000 Encounter for general adult medical examination without abnormal findings: Secondary | ICD-10-CM | POA: Diagnosis not present

## 2017-05-06 DIAGNOSIS — R7309 Other abnormal glucose: Secondary | ICD-10-CM | POA: Diagnosis not present

## 2017-05-06 DIAGNOSIS — I10 Essential (primary) hypertension: Secondary | ICD-10-CM | POA: Diagnosis not present

## 2017-05-06 DIAGNOSIS — Z125 Encounter for screening for malignant neoplasm of prostate: Secondary | ICD-10-CM | POA: Diagnosis not present

## 2017-05-13 DIAGNOSIS — Z0000 Encounter for general adult medical examination without abnormal findings: Secondary | ICD-10-CM | POA: Diagnosis not present

## 2017-05-13 DIAGNOSIS — I6789 Other cerebrovascular disease: Secondary | ICD-10-CM | POA: Diagnosis not present

## 2017-05-13 DIAGNOSIS — I25810 Atherosclerosis of coronary artery bypass graft(s) without angina pectoris: Secondary | ICD-10-CM | POA: Diagnosis not present

## 2017-05-13 DIAGNOSIS — R7309 Other abnormal glucose: Secondary | ICD-10-CM | POA: Diagnosis not present

## 2017-05-13 DIAGNOSIS — E7849 Other hyperlipidemia: Secondary | ICD-10-CM | POA: Diagnosis not present

## 2017-05-13 DIAGNOSIS — Z1389 Encounter for screening for other disorder: Secondary | ICD-10-CM | POA: Diagnosis not present

## 2017-05-13 DIAGNOSIS — Z23 Encounter for immunization: Secondary | ICD-10-CM | POA: Diagnosis not present

## 2017-05-13 DIAGNOSIS — I10 Essential (primary) hypertension: Secondary | ICD-10-CM | POA: Diagnosis not present

## 2017-05-17 DIAGNOSIS — Z1212 Encounter for screening for malignant neoplasm of rectum: Secondary | ICD-10-CM | POA: Diagnosis not present

## 2017-08-11 DIAGNOSIS — Z6841 Body Mass Index (BMI) 40.0 and over, adult: Secondary | ICD-10-CM | POA: Diagnosis not present

## 2017-08-11 DIAGNOSIS — L723 Sebaceous cyst: Secondary | ICD-10-CM | POA: Diagnosis not present

## 2017-09-03 DIAGNOSIS — E7849 Other hyperlipidemia: Secondary | ICD-10-CM | POA: Diagnosis not present

## 2017-09-03 DIAGNOSIS — I6523 Occlusion and stenosis of bilateral carotid arteries: Secondary | ICD-10-CM | POA: Diagnosis not present

## 2017-09-03 DIAGNOSIS — I2510 Atherosclerotic heart disease of native coronary artery without angina pectoris: Secondary | ICD-10-CM | POA: Diagnosis not present

## 2017-09-03 DIAGNOSIS — Z955 Presence of coronary angioplasty implant and graft: Secondary | ICD-10-CM | POA: Diagnosis not present

## 2017-09-03 DIAGNOSIS — I119 Hypertensive heart disease without heart failure: Secondary | ICD-10-CM | POA: Diagnosis not present

## 2017-09-03 DIAGNOSIS — Z79899 Other long term (current) drug therapy: Secondary | ICD-10-CM | POA: Diagnosis not present

## 2017-09-03 DIAGNOSIS — I252 Old myocardial infarction: Secondary | ICD-10-CM | POA: Diagnosis not present

## 2017-09-03 DIAGNOSIS — Z8711 Personal history of peptic ulcer disease: Secondary | ICD-10-CM | POA: Diagnosis not present

## 2018-05-23 DIAGNOSIS — Z125 Encounter for screening for malignant neoplasm of prostate: Secondary | ICD-10-CM | POA: Diagnosis not present

## 2018-05-23 DIAGNOSIS — R7309 Other abnormal glucose: Secondary | ICD-10-CM | POA: Diagnosis not present

## 2018-05-23 DIAGNOSIS — E7849 Other hyperlipidemia: Secondary | ICD-10-CM | POA: Diagnosis not present

## 2018-05-23 DIAGNOSIS — R82998 Other abnormal findings in urine: Secondary | ICD-10-CM | POA: Diagnosis not present

## 2018-05-23 DIAGNOSIS — I10 Essential (primary) hypertension: Secondary | ICD-10-CM | POA: Diagnosis not present

## 2018-05-24 ENCOUNTER — Other Ambulatory Visit: Payer: Self-pay | Admitting: Cardiology

## 2018-05-24 DIAGNOSIS — H02834 Dermatochalasis of left upper eyelid: Secondary | ICD-10-CM | POA: Diagnosis not present

## 2018-05-24 DIAGNOSIS — Z961 Presence of intraocular lens: Secondary | ICD-10-CM | POA: Diagnosis not present

## 2018-05-24 DIAGNOSIS — E119 Type 2 diabetes mellitus without complications: Secondary | ICD-10-CM | POA: Diagnosis not present

## 2018-05-24 DIAGNOSIS — H02831 Dermatochalasis of right upper eyelid: Secondary | ICD-10-CM | POA: Diagnosis not present

## 2018-05-24 NOTE — Telephone Encounter (Signed)
° ° ° °  1. Which medications need to be refilled? (please list name of each medication and dose if known) Ranitidine 150mg  one daily   2. Which pharmacy/location (including street and city if local pharmacy) is medication to be sent to?[facility] pharmacy  3. Do they need a 30 day or 90 day supply? [place]

## 2018-05-25 MED ORDER — RANITIDINE HCL 150 MG PO TABS
150.0000 mg | ORAL_TABLET | Freq: Every day | ORAL | 0 refills | Status: DC
Start: 2018-05-25 — End: 2018-05-25

## 2018-05-25 MED ORDER — RANITIDINE HCL 150 MG PO TABS: 150.0000 mg | ORAL_TABLET | Freq: Every day | ORAL | 0 refills | Status: DC

## 2018-05-25 NOTE — Telephone Encounter (Signed)
90 day refill okayed by Dr. Agustin Cree for Dr. Wynonia Lawman patient

## 2018-05-30 DIAGNOSIS — I10 Essential (primary) hypertension: Secondary | ICD-10-CM | POA: Diagnosis not present

## 2018-05-30 DIAGNOSIS — Z0000 Encounter for general adult medical examination without abnormal findings: Secondary | ICD-10-CM | POA: Diagnosis not present

## 2018-05-30 DIAGNOSIS — E7849 Other hyperlipidemia: Secondary | ICD-10-CM | POA: Diagnosis not present

## 2018-05-30 DIAGNOSIS — Z1389 Encounter for screening for other disorder: Secondary | ICD-10-CM | POA: Diagnosis not present

## 2018-05-30 DIAGNOSIS — R7309 Other abnormal glucose: Secondary | ICD-10-CM | POA: Diagnosis not present

## 2018-05-30 DIAGNOSIS — Z23 Encounter for immunization: Secondary | ICD-10-CM | POA: Diagnosis not present

## 2018-05-30 DIAGNOSIS — I25810 Atherosclerosis of coronary artery bypass graft(s) without angina pectoris: Secondary | ICD-10-CM | POA: Diagnosis not present

## 2018-05-30 DIAGNOSIS — I6789 Other cerebrovascular disease: Secondary | ICD-10-CM | POA: Diagnosis not present

## 2018-06-03 DIAGNOSIS — Z1212 Encounter for screening for malignant neoplasm of rectum: Secondary | ICD-10-CM | POA: Diagnosis not present

## 2018-07-28 ENCOUNTER — Telehealth: Payer: Self-pay | Admitting: Emergency Medicine

## 2018-07-28 NOTE — Telephone Encounter (Signed)
Patient 's wife informed per Dr. Geraldo Pitter he needs his pcp tp refill Zantac. She verbally understands

## 2018-07-28 NOTE — Telephone Encounter (Signed)
Left message for patient to return call regarding refill.

## 2018-09-01 NOTE — Progress Notes (Signed)
Maryellen Pile, MD Reason for referral-coronary artery disease  HPI: 73 year old male for evaluation of coronary artery disease at request of Bevelyn Buckles, MD.  Previously followed by Dr. Wynonia Lawman.  Patient underwent coronary artery bypass graft in 2007.  Also with history of carotid endarterectomy.  Last cardiac catheterization April 2015 showed occluded LAD, occluded second marginal and occluded right coronary artery.  Sequential saphenous vein graft to the diagonal and obtuse marginal was patent to the diagonal and occluded to the marginal.  Saphenous vein graft to the right coronary artery was noted to have a 99% focal stenosis.  LIMA to the LAD occluded.  Ejection fraction 50%.  Patient seen by surgery at the time and PCI felt best option.  Patient had PCI of the saphenous vein graft to the right coronary artery.  Carotid Dopplers September 2016 showed 40 to 59% right and patent left following carotid endarterectomy.  Patient denies dyspnea, chest pain, palpitations or syncope.  Current Outpatient Medications  Medication Sig Dispense Refill  . amLODipine (NORVASC) 5 MG tablet Take 5 mg by mouth every morning.    Marland Kitchen aspirin 81 MG chewable tablet Chew 81 mg by mouth daily. 1 tablet in the morning and 2 tablets in the evening.     . clopidogrel (PLAVIX) 75 MG tablet Take 75 mg by mouth daily.    Marland Kitchen FOLIC ACID PO Take by mouth.    . meloxicam (MOBIC) 15 MG tablet Take 1 tablet (15 mg total) by mouth daily. 30 tablet 0  . metFORMIN (GLUCOPHAGE) 1000 MG tablet Take 500 mg by mouth 2 (two) times daily with a meal.    . methylPREDNISolone (MEDROL) 4 MG TBPK tablet Tapering 6 day dose pack 21 tablet 0  . metoprolol (LOPRESSOR) 50 MG tablet Take 50 mg by mouth 2 (two) times daily.    . nitroGLYCERIN (NITROSTAT) 0.4 MG SL tablet Place 1 tablet (0.4 mg total) under the tongue every 5 (five) minutes x 3 doses as needed for chest pain. 25 tablet 12  . Omega-3 Fatty Acids (FISH OIL PO) Take 1  capsule by mouth at bedtime.    . pravastatin (PRAVACHOL) 80 MG tablet Take 80 mg by mouth daily.    . ranitidine (ZANTAC) 150 MG tablet Take 1 tablet (150 mg total) by mouth daily. 90 tablet 0  . Ticagrelor (BRILINTA) 90 MG TABS tablet Take 1 tablet (90 mg total) by mouth 2 (two) times daily. 60 tablet 12  . triamterene-hydrochlorothiazide (MAXZIDE-25) 37.5-25 MG per tablet Take 0.5 tablets by mouth every morning.     No current facility-administered medications for this visit.     Allergies  Allergen Reactions  . Simvastatin Other (See Comments)    Muscle aches and pains   . Ace Inhibitors Cough  . Atorvastatin Other (See Comments)    Muscle aches and pains      Past Medical History:  Diagnosis Date  . Acute myocardial infarction, unspecified site, initial episode of care   . CAD (coronary artery disease), native coronary artery    Cath Feb 2007 normal Left main, occluded LAD, 90% stenosis proximal CFX, occluded RCA, AM of RCA 70% CABG with LIMA to LAD, SVG to dx-OM, SVG to RCA 09/11/05 Dr. Cyndia Bent    . Carotid artery disease ([place])    Prior left CEA   . Diabetes mellitus type 2, noninsulin dependent ([place])   . History of GI bleed   . Hyperlipidemia 10/25/2013  . Hypertensive heart disease   .  Morbid obesity ([place])     Past Surgical History:  Procedure Laterality Date  . CAROTID ENDARTERECTOMY  02/24/02  . CATARACT EXTRACTION    . CORONARY ARTERY BYPASS GRAFT  09/11/05  . LEFT HEART CATHETERIZATION WITH CORONARY ANGIOGRAM N/A 10/26/2013   Procedure: LEFT HEART CATHETERIZATION WITH CORONARY ANGIOGRAM;  Surgeon: Sinclair Grooms, MD;  Location: [facility] CATH LAB;  Service: Cardiovascular;  Laterality: N/A;  . PERCUTANEOUS CORONARY STENT INTERVENTION (PCI-S) N/A 10/31/2013   Procedure: PERCUTANEOUS CORONARY STENT INTERVENTION (PCI-S);  Surgeon: Jettie Booze, MD;  Location[facility] LAB;  Service: Cardiovascular;  Laterality: N/A;  . UMBILICAL HERNIA REPAIR      Social History    Socioeconomic History  . Marital status: Married    Spouse name: Not on file  . Number of children: Not on file  . Years of education: Not on file  . Highest education level: Not on file  Occupational History  . Not on file  Social Needs  . Financial resource strain: Not on file  . Food insecurity:    Worry: Not on file    Inability: Not on file  . Transportation needs:    Medical: Not on file    Non-medical: Not on file  Tobacco Use  . Smoking status: Former Smoker    Years: 30.00  . Smokeless tobacco: Never Used  Substance and Sexual Activity  . Alcohol use: No  . Drug use: No  . Sexual activity: Yes  Lifestyle  . Physical activity:    Days per week: Not on file    Minutes per session: Not on file  . Stress: Not on file  Relationships  . Social connections:    Talks on phone: Not on file    Gets together: Not on file    Attends religious service: Not on file    Active member of club or organization: Not on file    Attends meetings of clubs or organizations: Not on file    Relationship status: Not on file  . Intimate partner violence:    Fear of current or ex partner: Not on file    Emotionally abused: Not on file    Physically abused: Not on file    Forced sexual activity: Not on file  Other Topics Concern  . Not on file  Social History Narrative   Therapist, nutritional for [employer]    Family History  Problem Relation Age of Onset  . CAD Father   . Diabetes Sister     ROS: no fevers or chills, productive cough, hemoptysis, dysphasia, odynophagia, melena, hematochezia, dysuria, hematuria, rash, seizure activity, orthopnea, PND, pedal edema, claudication. Remaining systems are negative.  Physical Exam:   Blood pressure 134/70, pulse 73, height 5\' 9"  (1.753 m), weight 292 lb 6.4 oz (132.6 kg).  General:  Well developed/obese in NAD Skin warm/dry Patient not depressed No peripheral clubbing Back-normal HEENT-normal/normal eyelids Neck  supple/normal carotid upstroke bilaterally; no bruits; no JVD; no thyromegaly chest - CTA/ normal expansion CV - RRR/normal S1 and S2; no murmurs, rubs or gallops;  PMI nondisplaced Abdomen -NT/ND, no HSM, no mass, + bowel sounds, no bruit 2+ femoral pulses, no bruits Ext-no edema, chords, 2+ DP Neuro-grossly nonfocal  ECG -normal sinus rhythm at a rate of 73, occasional PVCs, first-degree AV block, nonspecific ST changes.  Personally reviewed  A/P  1 coronary artery disease status post coronary artery bypass graft-patient doing well with no chest pain.  Plan to continue medical therapy  with aspirin and statin.  Discontinue Plavix.  2 carotid artery disease-schedule follow-up carotid Dopplers.  3 hypertension-patient's blood pressure is controlled.  Continue present medications and follow.  Potassium and renal function is monitored by primary care.  4 hyperlipidemia-continue statin.  Lipids and liver monitored by primary care.  I will have his most recent LDL forwarded to [place].  If not at goal we will consider changing from pravastatin to either Lipitor or Crestor.  5 bruit-schedule abdominal ultrasound to exclude aneurysm.  Kirk Ruths, MD

## 2018-09-13 ENCOUNTER — Other Ambulatory Visit: Payer: Self-pay | Admitting: Cardiology

## 2018-09-15 ENCOUNTER — Encounter: Payer: Self-pay | Admitting: Cardiology

## 2018-09-15 ENCOUNTER — Ambulatory Visit: Payer: Medicare HMO | Admitting: Cardiology

## 2018-09-15 VITALS — BP 134/70 | HR 73 | Ht 69.0 in | Wt 292.4 lb

## 2018-09-15 DIAGNOSIS — I480 Paroxysmal atrial fibrillation: Secondary | ICD-10-CM | POA: Diagnosis not present

## 2018-09-15 DIAGNOSIS — R0989 Other specified symptoms and signs involving the circulatory and respiratory systems: Secondary | ICD-10-CM | POA: Diagnosis not present

## 2018-09-15 DIAGNOSIS — I6523 Occlusion and stenosis of bilateral carotid arteries: Secondary | ICD-10-CM

## 2018-09-15 DIAGNOSIS — I2510 Atherosclerotic heart disease of native coronary artery without angina pectoris: Secondary | ICD-10-CM

## 2018-09-15 NOTE — Patient Instructions (Signed)
Medication Instructions:  STOP PLAVIX If you need a refill on your cardiac medications before your next appointment, please call your pharmacy.   Lab work: If you have labs (blood work) drawn today and your tests are completely normal, you will receive your results only by: Marland Kitchen MyChart Message (if you have MyChart) OR . A paper copy in the mail If you have any lab test that is abnormal or we need to change your treatment, we will call you to review the results.  Testing/Procedures: Your physician has requested that you have a carotid duplex. This test is an ultrasound of the carotid arteries in your neck. It looks at blood flow through these arteries that supply the brain with blood. Allow one hour for this exam. There are no restrictions or special instructions.   Your physician has requested that you have an abdominal aorta duplex. During this test, an ultrasound is used to evaluate the aorta. Allow 30 minutes for this exam. Do not eat after midnight the day before and avoid carbonated beverages   Follow-Up: At [facility], you and your health needs are our priority.  As part of our continuing mission to provide you with exceptional heart care, we have created designated Provider Care Teams.  These Care Teams include your primary Cardiologist (physician) and Advanced Practice Providers (APPs -  Physician Assistants and Nurse Practitioners) who all work together to provide you with the care you need, when you need it. You will need a follow up appointment in 12 months.  Please call our office 2 months in advance to schedule this appointment.  You may see Kirk Ruths MD or one of the following Advanced Practice Providers on your designated Care Team:   Kerin Ransom, PA-C Roby Lofts, [place] . Sande Rives, PA-C

## 2018-09-16 ENCOUNTER — Telehealth: Payer: Self-pay | Admitting: *Deleted

## 2018-09-16 ENCOUNTER — Other Ambulatory Visit: Payer: Self-pay

## 2018-09-16 DIAGNOSIS — I2510 Atherosclerotic heart disease of native coronary artery without angina pectoris: Secondary | ICD-10-CM

## 2018-09-16 DIAGNOSIS — I480 Paroxysmal atrial fibrillation: Secondary | ICD-10-CM

## 2018-09-16 MED ORDER — ROSUVASTATIN CALCIUM 40 MG PO TABS: 40.0000 mg | ORAL_TABLET | Freq: Every day | ORAL | 3 refills | Status: DC

## 2018-09-16 NOTE — Telephone Encounter (Signed)
I called patient, advised of message from nurse regarding medication change.  Patient states that he did not stop his plavix because he is concerned and does not want to stop it. Medication sent to pharmacy, advised patient of lab work that is needed in 6 weeks.

## 2018-09-16 NOTE — Telephone Encounter (Signed)
Pt returned this office call and asked that we call his cell phone back please.

## 2018-09-16 NOTE — Telephone Encounter (Signed)
Left message for pt to call, labs from PCP reviewed by dr Stanford Breed and due to the LDL being 113, dr Stanford Breed would like the patient to stop pravastatin and start crestor 40 mg once daily. He will need to have lab work 6 weeks after the change.

## 2018-09-16 NOTE — Telephone Encounter (Signed)
Lab orders mailed to the pt.  

## 2018-09-22 ENCOUNTER — Telehealth: Payer: Self-pay | Admitting: Cardiology

## 2018-09-22 NOTE — Telephone Encounter (Signed)
Called patient, he states that he would like to know the reasoning behind stopping the Plavix as he did not get one at his visit.  I advised I would route to nurse to have her discuss the reasoning that was given at last visit.  Patient advised that Ronnie Myers was out today and would return and try to contact him back. Patient verbalized understanding.

## 2018-09-22 NOTE — Telephone Encounter (Signed)
New Message   Pt c/o medication issue:  1. Name of Medication: Plavix   2. How are you currently taking this medication (dosage and times per day)? 75mg   3. Are you having a reaction (difficulty breathing--STAT)? No  4. What is your medication issue? Pt is calling and is wondering why he was advised to stop taking the Plavix.  Please call back

## 2018-09-23 NOTE — Telephone Encounter (Signed)
Spoke with pt, aware plavix was stopped because his last PCI was 2016. He will continue the aspirin and will probably stop the plavix.

## 2018-10-14 ENCOUNTER — Encounter (HOSPITAL_COMMUNITY): Payer: Medicare HMO

## 2018-12-05 ENCOUNTER — Encounter (HOSPITAL_COMMUNITY): Payer: Medicare HMO

## 2018-12-05 ENCOUNTER — Telehealth: Payer: Self-pay | Admitting: Cardiology

## 2018-12-05 MED ORDER — ROSUVASTATIN CALCIUM 40 MG PO TABS: 40.0000 mg | ORAL_TABLET | Freq: Every day | ORAL | 3 refills | Status: DC

## 2018-12-05 NOTE — Telephone Encounter (Signed)
° ° °  Pt c/o medication issue:  1. Name of Medication: rosuvastatin (CRESTOR) 40 MG tablet  2. How are you currently taking this medication (dosage and times per day)? n/a  3. Are you having a reaction (difficulty breathing--STAT)? no  4. What is your medication issue? Patient calling to confirm if  he should be taking Rosuvastatin or  Pravastatin.[employer] told patient medication was changed by Dr Stanford Breed

## 2018-12-05 NOTE — Telephone Encounter (Signed)
Per pt [employer] needed clarification on Rosuvastatin.Pharmacy notified of change and will send refill to pt ./cy

## 2018-12-13 ENCOUNTER — Telehealth: Payer: Self-pay | Admitting: Cardiology

## 2018-12-13 DIAGNOSIS — I6523 Occlusion and stenosis of bilateral carotid arteries: Secondary | ICD-10-CM

## 2018-12-13 MED ORDER — ROSUVASTATIN CALCIUM 40 MG PO TABS: 40.0000 mg | ORAL_TABLET | Freq: Every day | ORAL | 3 refills | Status: AC

## 2018-12-13 NOTE — Telephone Encounter (Signed)
Returned call to patient's wife.She stated her husband's medications are all messed up.She needs clarification if he is to take Pravastatin 80 mg or Crestor 40 mg.After reviewing chart advised he is to be taking Crestor 40 mg daily.She wanted to know if he needs to be taking Plavix.Advised last office note 09/15/18 says to stop Plavix.Also should he be taking Brilinta.I will send message to [facility] for advice.

## 2018-12-13 NOTE — Telephone Encounter (Signed)
Patient should not be taking Plavix or Brilinta.  Continue aspirin 81 mg daily.  He should be taking Crestor 40 mg daily.  No Pravachol.  Check lipids and liver as previously ordered. Kirk Ruths

## 2018-12-13 NOTE — Telephone Encounter (Signed)
  Wife is calling to discuss a situation with filling one of his medications. She states the nurse is aware of what is going on.

## 2018-12-13 NOTE — Telephone Encounter (Signed)
Spoke with pt wife, medication list discussed in detail.

## 2019-01-11 ENCOUNTER — Other Ambulatory Visit (HOSPITAL_COMMUNITY): Payer: Self-pay | Admitting: Cardiology

## 2019-01-11 ENCOUNTER — Other Ambulatory Visit: Payer: Self-pay | Admitting: *Deleted

## 2019-01-11 ENCOUNTER — Ambulatory Visit (HOSPITAL_COMMUNITY)
Admission: RE | Admit: 2019-01-11 | Discharge: 2019-01-11 | Disposition: A | Discharge status: Home / Self Care | Payer: Medicare HMO | Source: Ambulatory Visit | Attending: Internal Medicine | Admitting: Internal Medicine

## 2019-01-11 ENCOUNTER — Other Ambulatory Visit: Payer: Self-pay

## 2019-01-11 ENCOUNTER — Ambulatory Visit (HOSPITAL_BASED_OUTPATIENT_CLINIC_OR_DEPARTMENT_OTHER)
Admission: RE | Admit: 2019-01-11 | Discharge: 2019-01-11 | Disposition: A | Discharge status: Home / Self Care | Payer: Medicare HMO | Source: Ambulatory Visit | Attending: Cardiology | Admitting: Cardiology

## 2019-01-11 DIAGNOSIS — Z136 Encounter for screening for cardiovascular disorders: Secondary | ICD-10-CM | POA: Diagnosis not present

## 2019-01-11 DIAGNOSIS — I2510 Atherosclerotic heart disease of native coronary artery without angina pectoris: Secondary | ICD-10-CM | POA: Diagnosis not present

## 2019-01-11 DIAGNOSIS — R0989 Other specified symptoms and signs involving the circulatory and respiratory systems: Secondary | ICD-10-CM | POA: Diagnosis not present

## 2019-01-11 DIAGNOSIS — E785 Hyperlipidemia, unspecified: Secondary | ICD-10-CM | POA: Diagnosis not present

## 2019-01-11 DIAGNOSIS — I6523 Occlusion and stenosis of bilateral carotid arteries: Secondary | ICD-10-CM

## 2019-01-11 DIAGNOSIS — Z87891 Personal history of nicotine dependence: Secondary | ICD-10-CM | POA: Diagnosis not present

## 2019-01-11 DIAGNOSIS — Z9889 Other specified postprocedural states: Secondary | ICD-10-CM

## 2019-01-11 DIAGNOSIS — I10 Essential (primary) hypertension: Secondary | ICD-10-CM | POA: Insufficient documentation

## 2019-01-11 DIAGNOSIS — E119 Type 2 diabetes mellitus without complications: Secondary | ICD-10-CM | POA: Diagnosis not present

## 2019-05-13 DIAGNOSIS — D649 Anemia, unspecified: Secondary | ICD-10-CM | POA: Diagnosis not present

## 2019-05-13 DIAGNOSIS — I5020 Unspecified systolic (congestive) heart failure: Secondary | ICD-10-CM | POA: Diagnosis not present

## 2019-05-13 DIAGNOSIS — I509 Heart failure, unspecified: Secondary | ICD-10-CM | POA: Diagnosis not present

## 2019-05-13 DIAGNOSIS — J810 Acute pulmonary edema: Secondary | ICD-10-CM | POA: Diagnosis not present

## 2019-05-13 DIAGNOSIS — R079 Chest pain, unspecified: Secondary | ICD-10-CM | POA: Diagnosis not present

## 2019-05-13 DIAGNOSIS — Z20828 Contact with and (suspected) exposure to other viral communicable diseases: Secondary | ICD-10-CM | POA: Diagnosis not present

## 2019-05-13 DIAGNOSIS — E119 Type 2 diabetes mellitus without complications: Secondary | ICD-10-CM | POA: Diagnosis not present

## 2019-05-13 DIAGNOSIS — J9600 Acute respiratory failure, unspecified whether with hypoxia or hypercapnia: Secondary | ICD-10-CM | POA: Diagnosis not present

## 2019-05-13 DIAGNOSIS — R61 Generalized hyperhidrosis: Secondary | ICD-10-CM | POA: Diagnosis not present

## 2019-05-13 DIAGNOSIS — R9431 Abnormal electrocardiogram [ECG] [EKG]: Secondary | ICD-10-CM | POA: Diagnosis not present

## 2019-05-13 DIAGNOSIS — R000 Tachycardia, unspecified: Secondary | ICD-10-CM | POA: Diagnosis not present

## 2019-05-13 DIAGNOSIS — R072 Precordial pain: Secondary | ICD-10-CM | POA: Diagnosis not present

## 2019-05-13 DIAGNOSIS — I471 Supraventricular tachycardia: Secondary | ICD-10-CM | POA: Diagnosis not present

## 2019-05-13 DIAGNOSIS — E785 Hyperlipidemia, unspecified: Secondary | ICD-10-CM | POA: Diagnosis not present

## 2019-05-13 DIAGNOSIS — R0789 Other chest pain: Secondary | ICD-10-CM | POA: Diagnosis not present

## 2019-05-13 DIAGNOSIS — I5021 Acute systolic (congestive) heart failure: Secondary | ICD-10-CM | POA: Diagnosis not present

## 2019-05-13 DIAGNOSIS — D72829 Elevated white blood cell count, unspecified: Secondary | ICD-10-CM | POA: Diagnosis not present

## 2019-05-13 DIAGNOSIS — I5023 Acute on chronic systolic (congestive) heart failure: Secondary | ICD-10-CM | POA: Diagnosis not present

## 2019-05-13 DIAGNOSIS — J9601 Acute respiratory failure with hypoxia: Secondary | ICD-10-CM | POA: Diagnosis not present

## 2019-05-13 DIAGNOSIS — I214 Non-ST elevation (NSTEMI) myocardial infarction: Secondary | ICD-10-CM | POA: Diagnosis not present

## 2019-05-13 DIAGNOSIS — I4891 Unspecified atrial fibrillation: Secondary | ICD-10-CM | POA: Diagnosis not present

## 2019-05-13 DIAGNOSIS — I472 Ventricular tachycardia: Secondary | ICD-10-CM | POA: Diagnosis not present

## 2019-05-21 DEATH — deceased

## 2019-07-05 ENCOUNTER — Encounter: Payer: Self-pay | Admitting: Cardiology

## 2019-07-05 NOTE — Telephone Encounter (Signed)
error 

## 2019-08-24 ENCOUNTER — Telehealth: Payer: Self-pay | Admitting: *Deleted

## 2019-08-24 NOTE — Telephone Encounter (Signed)
A message was left,re: his follow up visit with Dr.Crenshaw
